# Patient Record
Sex: Male | Born: 1980
Health system: Southern US, Community
[De-identification: ages and names within clinical notes are randomized; demographics above are authoritative.]

## PROBLEM LIST (undated history)

## (undated) DIAGNOSIS — F419 Anxiety disorder, unspecified: Secondary | ICD-10-CM

## (undated) DIAGNOSIS — G8929 Other chronic pain: Secondary | ICD-10-CM

## (undated) DIAGNOSIS — I10 Essential (primary) hypertension: Secondary | ICD-10-CM

## (undated) DIAGNOSIS — F988 Other specified behavioral and emotional disorders with onset usually occurring in childhood and adolescence: Secondary | ICD-10-CM

## (undated) DIAGNOSIS — M545 Low back pain, unspecified: Secondary | ICD-10-CM

## (undated) DIAGNOSIS — T24331A Burn of third degree of right lower leg, initial encounter: Secondary | ICD-10-CM

## (undated) DIAGNOSIS — I7300 Raynaud's syndrome without gangrene: Secondary | ICD-10-CM

## (undated) HISTORY — DX: Other specified behavioral and emotional disorders with onset usually occurring in childhood and adolescence: F98.8

## (undated) HISTORY — PX: SKIN GRAFT: SHX250

## (undated) HISTORY — DX: Raynaud's syndrome without gangrene: I73.00

## (undated) HISTORY — DX: Burn of third degree of right lower leg, initial encounter: T24.331A

## (undated) HISTORY — DX: Anxiety disorder, unspecified: F41.9

---

## 2004-05-14 HISTORY — PX: ORIF ACROMIOCLAVICULAR JOINT: SUR916

## 2004-06-06 ENCOUNTER — Ambulatory Visit (HOSPITAL_BASED_OUTPATIENT_CLINIC_OR_DEPARTMENT_OTHER): Admission: RE | Admit: 2004-06-06 | Discharge: 2004-06-06 | Payer: Self-pay | Admitting: Orthopedic Surgery

## 2008-09-13 DIAGNOSIS — T24331A Burn of third degree of right lower leg, initial encounter: Secondary | ICD-10-CM

## 2008-09-13 HISTORY — DX: Burn of third degree of right lower leg, initial encounter: T24.331A

## 2008-09-27 ENCOUNTER — Emergency Department (HOSPITAL_COMMUNITY): Admission: EM | Admit: 2008-09-27 | Discharge: 2008-09-27 | Payer: Self-pay | Admitting: Emergency Medicine

## 2008-10-19 ENCOUNTER — Encounter: Admission: RE | Admit: 2008-10-19 | Discharge: 2008-10-29 | Payer: Self-pay | Admitting: Surgery
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
## 2011-09-15 LAB — BASIC METABOLIC PANEL
Calcium: 8.8
Chloride: 105
Creatinine, Ser: 0.81
GFR calc Af Amer: >60
GFR calc non Af Amer: >60

## 2011-09-15 LAB — DIFFERENTIAL
Lymphs Abs: 2.7
Monocytes Relative: 7
Neutro Abs: 3.9
Neutrophils Relative %: 52

## 2011-09-15 LAB — CBC
RBC: 5.43
WBC: 7.5

## 2011-12-01 ENCOUNTER — Ambulatory Visit (INDEPENDENT_AMBULATORY_CARE_PROVIDER_SITE_OTHER): Payer: BC Managed Care – PPO | Admitting: Physician Assistant

## 2011-12-01 DIAGNOSIS — E782 Mixed hyperlipidemia: Secondary | ICD-10-CM

## 2011-12-01 DIAGNOSIS — J029 Acute pharyngitis, unspecified: Secondary | ICD-10-CM

## 2011-12-01 DIAGNOSIS — J069 Acute upper respiratory infection, unspecified: Secondary | ICD-10-CM

## 2012-01-15 ENCOUNTER — Telehealth: Payer: Self-pay

## 2012-01-15 NOTE — Telephone Encounter (Signed)
.  [facility] PT WOULD LIKE TO SPEAK WITH CHELLE REGARDING HIS MEDS PLEASE CALL [phone]

## 2012-01-16 NOTE — Telephone Encounter (Signed)
LMOM for patient to call back with questions.

## 2012-01-16 NOTE — Telephone Encounter (Signed)
Patient called back.  States his meds for cholesterol are over $200 apiece!  Pt would like more affordable meds called in.  He was previously rx'd Deplin and Niaspan.  Pt uses [facility].  Please advise next med.Marland KitchenMarland Kitchen

## 2012-01-17 ENCOUNTER — Encounter: Payer: Self-pay | Admitting: Physician Assistant

## 2012-01-17 DIAGNOSIS — I7300 Raynaud's syndrome without gangrene: Secondary | ICD-10-CM | POA: Insufficient documentation

## 2012-01-17 DIAGNOSIS — F419 Anxiety disorder, unspecified: Secondary | ICD-10-CM | POA: Insufficient documentation

## 2012-01-17 DIAGNOSIS — F988 Other specified behavioral and emotional disorders with onset usually occurring in childhood and adolescence: Secondary | ICD-10-CM | POA: Insufficient documentation

## 2012-01-17 NOTE — Telephone Encounter (Signed)
I would like him to go ahead and fill the Niaspan.  When we re-check his lipids in 12 weeks, we can see if we need to add the Deplin. csj

## 2012-01-17 NOTE — Telephone Encounter (Signed)
Per patient, Niaspan still too expensive.    Chelle advises we contact pharmacy to find out why rx is that cost.  [facility] closed for Sunday... Please contact them tomorrow.

## 2012-03-15 ENCOUNTER — Telehealth: Payer: Self-pay

## 2012-03-15 MED ORDER — AMPHETAMINE-DEXTROAMPHETAMINE 30 MG PO TABS: 15.0000 mg | ORAL_TABLET | Freq: Every day | ORAL | Status: DC

## 2012-03-15 NOTE — Telephone Encounter (Signed)
Signed at TL desk.  

## 2012-03-15 NOTE — Telephone Encounter (Signed)
Advised pt that rx is ready for pick up.

## 2012-03-15 NOTE — Telephone Encounter (Signed)
PT REQUESTING ADDERALL REFILL    BEST PHONE [phone]

## 2012-03-16 ENCOUNTER — Telehealth: Payer: Self-pay

## 2012-03-16 MED ORDER — AMPHETAMINE-DEXTROAMPHETAMINE 30 MG PO TABS: 15.0000 mg | ORAL_TABLET | Freq: Three times a day (TID) | ORAL | Status: DC

## 2012-03-16 NOTE — Telephone Encounter (Signed)
I accidentally wrote the adderall for qd not tid.  The Rx has been fixed and is ready for pickup.

## 2012-03-16 NOTE — Telephone Encounter (Signed)
Addended by: Morrell Riddle on: 03/16/2012 01:07 PM   Modules accepted: Orders

## 2012-03-16 NOTE — Telephone Encounter (Signed)
Andrew Chen with pharmacy earlier and clarified rx.

## 2012-03-16 NOTE — Telephone Encounter (Signed)
Pharmacist has questions about adderall 30mg    The old rx was for 1/2 3 times a day and now the new one is 1/2 daily   They would like clarification  Best number [phone]

## 2012-03-17 NOTE — Telephone Encounter (Signed)
Notified pt that Rx has been corrected and is ready for p/up

## 2012-04-18 ENCOUNTER — Telehealth: Payer: Self-pay

## 2012-04-18 MED ORDER — DILTIAZEM HCL ER COATED BEADS 240 MG PO CP24: 240.0000 mg | ORAL_CAPSULE | Freq: Every day | ORAL | Status: DC

## 2012-04-18 MED ORDER — AMPHETAMINE-DEXTROAMPHETAMINE 30 MG PO TABS: 15.0000 mg | ORAL_TABLET | Freq: Three times a day (TID) | ORAL | Status: DC

## 2012-04-18 NOTE — Telephone Encounter (Signed)
Due for re-evaluation next month. Cardizem and Adderall printed.

## 2012-04-18 NOTE — Telephone Encounter (Signed)
Looks like we need to recheck patient for both his ADD and HTN-- can we do 30 day rx and have him follow up for more?

## 2012-04-18 NOTE — Telephone Encounter (Signed)
.  [facility] The patient called for refill of his Cardizem Rx and his Adderall Rx.  The patient states he gets 90 day supply of Cardizem and usually picks it up with Adderall Rx.  Please call patient at [phone] when Rx is ready for pick up.

## 2012-04-18 NOTE — Telephone Encounter (Signed)
Spoke with patient and advised medications were ready for pickup, but that he was due for office visit next month.  Patient was frustrated with this and stated he should have been told this when he left the message earlier today.  I let him know that this was not urgent that he come in right away, just follow up in the next 30 days.  Patient didn't like this, and refused to take refills we have already written.  He will CB and schedule appointment and have all of his medications refilled at one time.  Rx's placed in patients chart to hold until patient comes in (per Chelles request).

## 2012-04-21 ENCOUNTER — Ambulatory Visit (INDEPENDENT_AMBULATORY_CARE_PROVIDER_SITE_OTHER): Payer: BC Managed Care – PPO | Admitting: Physician Assistant

## 2012-04-21 ENCOUNTER — Encounter: Payer: Self-pay | Admitting: Physician Assistant

## 2012-04-21 VITALS — BP 132/106 | HR 95 | Temp 98.6°F | Resp 18 | Ht 73.0 in | Wt 187.0 lb

## 2012-04-21 DIAGNOSIS — I10 Essential (primary) hypertension: Secondary | ICD-10-CM

## 2012-04-21 DIAGNOSIS — E785 Hyperlipidemia, unspecified: Secondary | ICD-10-CM

## 2012-04-21 DIAGNOSIS — I7300 Raynaud's syndrome without gangrene: Secondary | ICD-10-CM

## 2012-04-21 DIAGNOSIS — B360 Pityriasis versicolor: Secondary | ICD-10-CM

## 2012-04-21 DIAGNOSIS — F909 Attention-deficit hyperactivity disorder, unspecified type: Secondary | ICD-10-CM

## 2012-04-21 DIAGNOSIS — M25476 Effusion, unspecified foot: Secondary | ICD-10-CM

## 2012-04-21 MED ORDER — AMPHETAMINE-DEXTROAMPHETAMINE 30 MG PO TABS: ORAL_TABLET | ORAL | Status: DC

## 2012-04-21 MED ORDER — KETOCONAZOLE 200 MG PO TABS: 400.0000 mg | ORAL_TABLET | Freq: Once | ORAL | Status: AC

## 2012-04-21 MED ORDER — AMPHETAMINE-DEXTROAMPHET ER 30 MG PO CP24: 30.0000 mg | ORAL_CAPSULE | Freq: Three times a day (TID) | ORAL | Status: DC

## 2012-04-21 MED ORDER — DILTIAZEM HCL ER COATED BEADS 240 MG PO CP24: 240.0000 mg | ORAL_CAPSULE | Freq: Every day | ORAL | Status: DC

## 2012-04-21 NOTE — Progress Notes (Signed)
  Subjective:    Patient ID: Andrew Chen, male    DOB: 1981/10/18, 31 y.o.   MRN: [id number]  HPI  Presents for refill of Cardizem, which he uses to treat Raynaud's phenomenon.  Additionally, needs new prescriptions of Adderall for ADD and requests a new Rx for ketoconazole for treatment of recurrent tinea versicolor.  Allergies have been worse this season.  Using OTC Allegra D daily, and occasionally an additional dose of Benadryl, and on one occasion, used nasal decongestant spray.  Notes that the morning dose of Adderall (half of a 30 mg tab) is no longer effective.  The afternoon and evening doses work well.  He never filled the prescriptions for niaspan and deplin I advised after his lipscience results in 12/2011 revealed his increased lipids.  His father, and multiple other paternal relatives, have had early cardiovascular disease.  Instead, he has eliminated fast food and reports making much healthier eating choices overall.  He's hopeful that his levels will have improved.  Review of Systems No chest pain, SOB, HA, dizziness, vision change, N/V, diarrhea, dysuria, myalgias, arthralgias or rash.     Objective:   Physical Exam Vital signs noted. Well-developed, well nourished WM who is awake, alert and oriented, in NAD. HEENT: [place]/AT, sclera and conjunctiva are clear.   Nasal mucosa is pink and moist. OP is clear. Neck: supple, non-tender, no lymphadenopathy, thyromegaly. Heart: RRR, no murmur Lungs: CTA Extremities: no cyanosis, clubbing or edema. Skin: warm and dry without rash.  LipoScience profile updated today to compare with previous measures.    Assessment & Plan:   1. Hyperlipidemia  Await LipoScience results.  2. Raynaud's phenomenon  diltiazem (CARDIZEM CD) 240 MG 24 hr capsule  3. Tinea versicolor  ketoconazole (NIZORAL) 200 MG tablet  4. ADD (attention deficit disorder with hyperactivity)  amphetamine-dextroamphetamine (ADDERALL, 30MG ,) 30 MG tablet (rx's for  May, June and July)   Re-evaluate in 6 months.

## 2012-05-02 ENCOUNTER — Other Ambulatory Visit: Payer: Self-pay | Admitting: Physician Assistant

## 2012-05-02 DIAGNOSIS — E785 Hyperlipidemia, unspecified: Secondary | ICD-10-CM

## 2012-05-02 MED ORDER — NIACIN ER (ANTIHYPERLIPIDEMIC) 500 MG PO TBCR: 500.0000 mg | EXTENDED_RELEASE_TABLET | Freq: Every day | ORAL | Status: DC

## 2012-05-02 NOTE — Progress Notes (Signed)
Advised patient of labs and Rx was sent to [facility].  I have mailed patient the original Labs per Chelle.  Patient states understanding and will return to clinic in 3-6 months.

## 2012-05-23 ENCOUNTER — Telehealth: Payer: Self-pay

## 2012-05-23 NOTE — Telephone Encounter (Signed)
PATIENT WANTS CHELLE TO CALL HIM BACK ABOUT HIS BLOODWORK AND MEDICATION

## 2012-05-25 NOTE — Telephone Encounter (Signed)
Please call patient for details

## 2012-05-25 NOTE — Telephone Encounter (Signed)
Yes, non-fasting can elevate the levels, especially the triglycerides.  However, given his family history, I still encourage him to take the Niaspan. Per the info I have, the approximate retail price of 500mg  is $89.99 for #30.  If his pharmacy is charging more than that, he should shop around for a better price.  I suggest [email or web address] out of [place], [place].  They do mail order and have a store, but advertise some of the best prices around.

## 2012-05-25 NOTE — Telephone Encounter (Signed)
Pt indicates the Niacin Rx was $312, states he was not fasting for blood work, and wants to know if this affects the lab results, for Cholesterol level. States he has changed his diet and watching what he eats, and has eliminated fast foods. Wants to have rechecked fasting. Please advise.

## 2012-05-26 NOTE — Telephone Encounter (Signed)
Gave pt message. Pt agreed to check pricing at different pharmacies and will let us know what he decides.

## 2012-09-01 ENCOUNTER — Telehealth: Payer: Self-pay

## 2012-09-01 MED ORDER — AMPHETAMINE-DEXTROAMPHETAMINE 30 MG PO TABS: ORAL_TABLET | ORAL | Status: DC

## 2012-09-01 NOTE — Telephone Encounter (Signed)
Pt is requesting rx for adderrall and his other rx he couldn't remember the name of please call when ready for pick-up. [phone]

## 2012-09-01 NOTE — Telephone Encounter (Signed)
Adderall refill is printed and up front. Patient is on several different medications. I need to know which medication he specifically needs a refill of before I can review it for possible refill.

## 2012-09-01 NOTE — Telephone Encounter (Signed)
Pt doesn't remember med, possibly Xanax?

## 2012-09-01 NOTE — Telephone Encounter (Signed)
Please call his pharmacy he was given #90 pills on 04/21/12 with 3 refills. He takes one tab per day. Should have some on file.

## 2012-09-01 NOTE — Telephone Encounter (Signed)
Rx up font. Called pt he needs diltizeim.

## 2012-09-02 NOTE — Telephone Encounter (Signed)
Patient advised.

## 2012-09-02 NOTE — Telephone Encounter (Signed)
Called pharmacy who reported that pt does have 3 more RFs of his diltiazem on file and they will get one ready for him.  LMOM for pt to CB.

## 2012-11-05 ENCOUNTER — Telehealth: Payer: Self-pay

## 2012-11-05 NOTE — Telephone Encounter (Signed)
The patient called to request refill of his Adderall rx.  Please call the patient at [phone] when ready for pick up.

## 2012-11-06 MED ORDER — AMPHETAMINE-DEXTROAMPHETAMINE 30 MG PO TABS: ORAL_TABLET | ORAL | Status: DC

## 2012-11-06 NOTE — Telephone Encounter (Signed)
Patient notified and voiced understanding.

## 2012-11-06 NOTE — Telephone Encounter (Signed)
Rx printed.  Needs OV for additional fills.

## 2012-12-17 ENCOUNTER — Telehealth: Payer: Self-pay

## 2012-12-17 NOTE — Telephone Encounter (Signed)
Patient would like refill of adderall. °

## 2012-12-18 NOTE — Telephone Encounter (Signed)
Needs office visit per last prescription.  He was last seen in May.

## 2012-12-19 ENCOUNTER — Ambulatory Visit (INDEPENDENT_AMBULATORY_CARE_PROVIDER_SITE_OTHER): Payer: BC Managed Care – PPO | Admitting: Emergency Medicine

## 2012-12-19 VITALS — BP 134/90 | HR 102 | Temp 98.3°F | Resp 16 | Ht 73.5 in | Wt 182.6 lb

## 2012-12-19 DIAGNOSIS — I7300 Raynaud's syndrome without gangrene: Secondary | ICD-10-CM

## 2012-12-19 DIAGNOSIS — F988 Other specified behavioral and emotional disorders with onset usually occurring in childhood and adolescence: Secondary | ICD-10-CM

## 2012-12-19 MED ORDER — AMPHETAMINE-DEXTROAMPHETAMINE 30 MG PO TABS: ORAL_TABLET | ORAL | Status: DC

## 2012-12-19 NOTE — Progress Notes (Signed)
  Subjective:    Patient ID: Andrew Chen, male    DOB: [birth year], 32 y.o.   MRN: [id number]  HPI Patient in for followup of his Raynaud's phenomenon as well as his ADD. His ADD medicines he takes one in the morning half around noon and a half around 4. His Cardizem he takes every day and if he skips a dose he does have significant problems with his hands to   Review of Systems     Objective:   Physical Exam patient is alert and cooperative in no distress. His neck is supple. His chest is clear to auscultation and percussion. Skin is warm and to        Assessment & Plan:  Problem Is ray not phenomenon stable on diltiazem sustained release. Problem #2 ADD stable on current medications. Problem #3 anxiety disorder which is currently stable. He requires approximately one Xanax per week. He tends to have more panic attacks when there is a loss in the family. It is okay for him to call in 3 months to get a 3 month refill on his ADD medicines.

## 2012-12-19 NOTE — Telephone Encounter (Signed)
Spoke with pt, advised to RTC. Pt understood. 

## 2013-03-16 ENCOUNTER — Encounter: Payer: Self-pay | Admitting: Internal Medicine

## 2013-06-29 ENCOUNTER — Telehealth: Payer: Self-pay

## 2013-06-29 ENCOUNTER — Ambulatory Visit: Payer: BC Managed Care – PPO

## 2013-06-29 ENCOUNTER — Ambulatory Visit: Payer: BC Managed Care – PPO | Admitting: Family Medicine

## 2013-06-29 VITALS — BP 130/76 | HR 74 | Temp 98.4°F | Resp 18 | Ht 74.0 in | Wt 196.0 lb

## 2013-06-29 DIAGNOSIS — F988 Other specified behavioral and emotional disorders with onset usually occurring in childhood and adolescence: Secondary | ICD-10-CM

## 2013-06-29 MED ORDER — AMPHETAMINE-DEXTROAMPHETAMINE 30 MG PO TABS: ORAL_TABLET | ORAL | Status: DC

## 2013-06-29 NOTE — Telephone Encounter (Signed)
Pt requesting adderall refill  Best phone [phone]
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
## 2013-06-29 NOTE — Telephone Encounter (Signed)
Called patient he is due for follow up for Adderall refill, he wants appt, he is transferred.

## 2014-02-02 ENCOUNTER — Other Ambulatory Visit: Payer: Self-pay | Admitting: Physician Assistant

## 2014-02-02 NOTE — Telephone Encounter (Signed)
Pt needs OV 

## 2014-02-16 ENCOUNTER — Ambulatory Visit (INDEPENDENT_AMBULATORY_CARE_PROVIDER_SITE_OTHER): Payer: No Typology Code available for payment source | Admitting: Internal Medicine

## 2014-02-16 ENCOUNTER — Ambulatory Visit: Payer: No Typology Code available for payment source

## 2014-02-16 VITALS — BP 132/100 | HR 104 | Temp 98.0°F | Resp 16 | Ht 73.5 in | Wt 198.0 lb

## 2014-02-16 DIAGNOSIS — E785 Hyperlipidemia, unspecified: Secondary | ICD-10-CM

## 2014-02-16 DIAGNOSIS — M79644 Pain in right finger(s): Secondary | ICD-10-CM

## 2014-02-16 DIAGNOSIS — I7300 Raynaud's syndrome without gangrene: Secondary | ICD-10-CM

## 2014-02-16 DIAGNOSIS — F411 Generalized anxiety disorder: Secondary | ICD-10-CM

## 2014-02-16 DIAGNOSIS — M79609 Pain in unspecified limb: Secondary | ICD-10-CM

## 2014-02-16 DIAGNOSIS — F988 Other specified behavioral and emotional disorders with onset usually occurring in childhood and adolescence: Secondary | ICD-10-CM

## 2014-02-16 DIAGNOSIS — F419 Anxiety disorder, unspecified: Secondary | ICD-10-CM

## 2014-02-16 MED ORDER — ALPRAZOLAM 0.5 MG PO TABS: 0.5000 mg | ORAL_TABLET | Freq: Every day | ORAL | Status: DC | PRN

## 2014-02-16 MED ORDER — AMPHETAMINE-DEXTROAMPHETAMINE 30 MG PO TABS: ORAL_TABLET | ORAL | Status: DC

## 2014-02-16 MED ORDER — ALPRAZOLAM 0.5 MG PO TABS
0.5000 mg | ORAL_TABLET | Freq: Every day | ORAL | Status: DC | PRN
Start: 2014-02-16 — End: 2015-01-09

## 2014-02-16 MED ORDER — DILTIAZEM HCL ER COATED BEADS 240 MG PO CP24: ORAL_CAPSULE | ORAL | Status: DC

## 2014-02-16 NOTE — Patient Instructions (Signed)
If you have not heard anything regarding the referral in 1 week, please contact our office.  

## 2014-02-16 NOTE — Progress Notes (Signed)
Subjective:    Patient ID: Andrew Chen., male    DOB: 04/14/1981, 33 y.o.   MRN: [id number]   PCP: Muntaha Vermette, PA-C  Chief Complaint  Patient presents with  . Hand Injury    right hand pain  . Medication Refill    cardizem    Medications, allergies, past medical history, surgical history, family history, social history and problem list reviewed and updated.  HPI  Skiing almost 2 weeks ago.  [place] and injured the RIGHT hand.  RIGHT hand dominant. Taped the thumb and continued skiing x 2 days.  Has not had much work since then, and has wrapped it with an ACE wrap. Swelling that obscurred the 2nd and 3rd MCPs, is now nearly resolved.  Ecchymosis involving the THUMB and wrist is resolved. Pain with grasping. Pain with ROM.  Locates the worst pain at the MCP, dorsal aspect.   2 days ago he was putting a leaf blower pack on his back with the affected hand and felt/heard a loud crack, followed by immediate improvement in his pain.  There is still considerable pain, but much better. He's had increased, but still limited, ROM of the thumb.  Additionally, he needs refills of cardizem, Adderall and alprazolam.  Those are working well for treatment of Raynaud's phenomenon, anxiety and ADD.  Review of Systems As above.    Objective:   Physical Exam  Vitals reviewed. Constitutional: He is oriented to person, place, and time. He appears well-developed and well-nourished. He is active and cooperative. No distress.  BP 132/100  Pulse 104  Temp(Src) 98 F (36.7 C) (Oral)  Resp 16  Ht 6' 1.5" (1.867 m)  Wt 198 lb (89.812 kg)  BMI 25.77 kg/m2  SpO2 98%   HENT:  Head: Normocephalic and atraumatic.  Eyes: Conjunctivae are normal. No scleral icterus.  Neck: Normal range of motion. Neck supple. No thyromegaly present.  Cardiovascular: Regular rhythm, normal heart sounds and intact distal pulses.   Pulmonary/Chest: Effort normal and breath sounds normal.  Musculoskeletal:   Right wrist: He exhibits decreased range of motion, tenderness and swelling.       Right hand: He exhibits normal capillary refill (mild pallor consistent with Raynaud's). Normal sensation noted. Decreased strength (due to pain) noted.       Hands: Lymphadenopathy:    He has no cervical adenopathy.  Neurological: He is alert and oriented to person, place, and time.  Skin: Skin is warm and dry. No erythema.  Psychiatric: He has a normal mood and affect. His behavior is normal.   RIGHT Thumb: [facility] reading (PRIMARY) by  Dr. Perrin Maltese.  No bony deformity.         Assessment & Plan:  1. Pain of right thumb I suspect he had a dislocation, followed by reduction a couple of days ago.  Given the severity of his continued pain, despite a stable joint, he's splinted and referred to Hand. - DG Finger Thumb Right; Future - Ambulatory referral to Hand Surgery  2. Raynaud's phenomenon Stable.  Continue current treatment. - diltiazem (CARDIZEM CD) 240 MG 24 hr capsule; TAKE 1 CAPSULE BY MOUTH ONCE A DAY  Dispense: 90 capsule; Refill: 3  3. Hyperlipidemia Not fasting this morning.  Several years since last lipid profile.  His father had early onset CAD, and TD is at risk.  Plan fasting labs this spring/summer, at his convenience.  4. ADD (attention deficit disorder) Stable.  Continue current regimen. - amphetamine-dextroamphetamine (ADDERALL) 30 MG tablet; Take 1  PO QAM, 1/2 PO Qmidday, 1/2 PO QPM.  Dispense: 60 tablet; Refill: 0 - amphetamine-dextroamphetamine (ADDERALL) 30 MG tablet; Take 1 PO QAM, 1/2 PO Qmidday, 1/2 PO QPM. May fill 30 days after date on prescription.  Dispense: 60 tablet; Refill: 0 - amphetamine-dextroamphetamine (ADDERALL) 30 MG tablet; Take 1 PO QAM, 1/2 PO Qmidday, 1/2 PO QPM. May fill 60 days after date on prescription.  Dispense: 60 tablet; Refill: 0  5. Anxiety Stable.  Continue current regimen. - ALPRAZolam (XANAX) 0.5 MG tablet; Take 1 tablet (0.5 mg total) by mouth  daily as needed for anxiety.  Dispense: 30 tablet; Refill: 0 - ALPRAZolam (XANAX) 0.5 MG tablet; Take 1 tablet (0.5 mg total) by mouth daily as needed for anxiety. May fill 30 days after date on prescription  Dispense: 30 tablet; Refill: 0 - ALPRAZolam (XANAX) 0.5 MG tablet; Take 1 tablet (0.5 mg total) by mouth daily as needed for anxiety. May fill 60 days after date on prescription  Dispense: 30 tablet; Refill: 0   Fernande Brashelle S. Nubia Ziesmer, PA-C Physician Assistant-Certified [facility]

## 2014-02-27 ENCOUNTER — Encounter: Payer: Self-pay | Admitting: Physician Assistant

## 2014-02-28 NOTE — Telephone Encounter (Signed)
In referral notes see Referral was sent to [facility] 3/11- called to check status for pt. Alex states that they do not have information on this referral. Contacted our referral dept and they are resending to [facility].

## 2014-02-28 NOTE — Telephone Encounter (Signed)
Andrew Chen is working on this. She said she was trying to find an office that accepts pt's insurance.

## 2014-03-01 ENCOUNTER — Encounter: Payer: Self-pay | Admitting: Physician Assistant

## 2014-03-01 NOTE — Telephone Encounter (Signed)
Chelle, FYI for appt date and time. Made a copy of his disc and let him know it was up front for p/u

## 2014-03-07 ENCOUNTER — Ambulatory Visit (HOSPITAL_COMMUNITY)
Admission: RE | Admit: 2014-03-07 | Discharge: 2014-03-07 | Disposition: A | Discharge status: Home / Self Care | Payer: No Typology Code available for payment source | Source: Ambulatory Visit | Attending: Sports Medicine | Admitting: Sports Medicine

## 2014-03-07 ENCOUNTER — Other Ambulatory Visit (HOSPITAL_COMMUNITY): Payer: Self-pay | Admitting: Sports Medicine

## 2014-03-07 DIAGNOSIS — M79641 Pain in right hand: Secondary | ICD-10-CM

## 2014-03-07 DIAGNOSIS — Z135 Encounter for screening for eye and ear disorders: Secondary | ICD-10-CM | POA: Insufficient documentation

## 2014-03-16 ENCOUNTER — Other Ambulatory Visit: Payer: Self-pay | Admitting: Sports Medicine

## 2014-03-16 DIAGNOSIS — S6390XA Sprain of unspecified part of unspecified wrist and hand, initial encounter: Secondary | ICD-10-CM

## 2014-03-21 ENCOUNTER — Ambulatory Visit
Admission: RE | Admit: 2014-03-21 | Discharge: 2014-03-21 | Disposition: A | Discharge status: Home / Self Care | Payer: No Typology Code available for payment source | Source: Ambulatory Visit | Attending: Sports Medicine | Admitting: Sports Medicine

## 2014-03-21 DIAGNOSIS — S6390XA Sprain of unspecified part of unspecified wrist and hand, initial encounter: Secondary | ICD-10-CM

## 2014-07-02 ENCOUNTER — Telehealth: Payer: Self-pay | Admitting: Physician Assistant

## 2014-07-02 DIAGNOSIS — F988 Other specified behavioral and emotional disorders with onset usually occurring in childhood and adolescence: Secondary | ICD-10-CM

## 2014-07-03 MED ORDER — AMPHETAMINE-DEXTROAMPHETAMINE 30 MG PO TABS: ORAL_TABLET | ORAL | Status: DC

## 2014-07-03 MED ORDER — AMPHETAMINE-DEXTROAMPHETAMINE 30 MG PO TABS
ORAL_TABLET | ORAL | Status: DC
Start: 2014-07-03 — End: 2015-01-09

## 2014-07-03 NOTE — Telephone Encounter (Signed)
Patient notified via My Chart.  Meds ordered this encounter  Medications  . amphetamine-dextroamphetamine (ADDERALL) 30 MG tablet    Sig: Take 1 PO QAM, 1/2 PO Qmidday, 1/2 PO QPM.    Dispense:  60 tablet    Refill:  0    Order Specific Question:  Supervising Provider    Answer:  DOOLITTLE, ROBERT P [[id number]]  . amphetamine-dextroamphetamine (ADDERALL) 30 MG tablet    Sig: Take 1 PO QAM, 1/2 PO Qmidday, 1/2 PO QPM. May fill 30 days after date on prescription.    Dispense:  60 tablet    Refill:  0    Order Specific Question:  Supervising Provider    Answer:  DOOLITTLE, ROBERT P [[id number]]  . amphetamine-dextroamphetamine (ADDERALL) 30 MG tablet    Sig: Take 1 PO QAM, 1/2 PO Qmidday, 1/2 PO QPM. May fill 60 days after date on prescription.    Dispense:  60 tablet    Refill:  0    Order Specific Question:  Supervising Provider    Answer:  DOOLITTLE, ROBERT P [[id number]]

## 2014-07-03 NOTE — Telephone Encounter (Signed)
Prescriptions in pick up drawer.

## 2014-09-28 ENCOUNTER — Other Ambulatory Visit: Payer: Self-pay

## 2015-01-09 ENCOUNTER — Telehealth: Payer: Self-pay | Admitting: Physician Assistant

## 2015-01-09 DIAGNOSIS — I7300 Raynaud's syndrome without gangrene: Secondary | ICD-10-CM

## 2015-01-09 DIAGNOSIS — F988 Other specified behavioral and emotional disorders with onset usually occurring in childhood and adolescence: Secondary | ICD-10-CM

## 2015-01-09 DIAGNOSIS — F419 Anxiety disorder, unspecified: Secondary | ICD-10-CM

## 2015-01-09 MED ORDER — AMPHETAMINE-DEXTROAMPHETAMINE 30 MG PO TABS: ORAL_TABLET | ORAL | Status: DC

## 2015-01-09 MED ORDER — ALPRAZOLAM 0.5 MG PO TABS
0.5000 mg | ORAL_TABLET | Freq: Every day | ORAL | Status: DC | PRN
Start: 2015-01-09 — End: 2016-01-24

## 2015-01-09 MED ORDER — ALPRAZOLAM 0.5 MG PO TABS: 0.5000 mg | ORAL_TABLET | Freq: Every day | ORAL | Status: DC | PRN

## 2015-01-09 MED ORDER — DILTIAZEM HCL ER COATED BEADS 240 MG PO CP24: ORAL_CAPSULE | ORAL | Status: DC

## 2015-01-09 NOTE — Telephone Encounter (Signed)
Needs refills of his medications. Over-eating, especially high sugar foods. Will get new health insurance and schedule follow-up.

## 2016-01-24 ENCOUNTER — Ambulatory Visit (INDEPENDENT_AMBULATORY_CARE_PROVIDER_SITE_OTHER): Payer: Self-pay | Admitting: Physician Assistant

## 2016-01-24 VITALS — BP 114/76 | HR 85 | Temp 98.6°F | Resp 16 | Ht 73.5 in | Wt 216.0 lb

## 2016-01-24 DIAGNOSIS — R3589 Other polyuria: Secondary | ICD-10-CM

## 2016-01-24 DIAGNOSIS — R358 Other polyuria: Secondary | ICD-10-CM

## 2016-01-24 DIAGNOSIS — F988 Other specified behavioral and emotional disorders with onset usually occurring in childhood and adolescence: Secondary | ICD-10-CM

## 2016-01-24 DIAGNOSIS — F419 Anxiety disorder, unspecified: Secondary | ICD-10-CM

## 2016-01-24 DIAGNOSIS — I7300 Raynaud's syndrome without gangrene: Secondary | ICD-10-CM

## 2016-01-24 DIAGNOSIS — F909 Attention-deficit hyperactivity disorder, unspecified type: Secondary | ICD-10-CM

## 2016-01-24 LAB — POCT GLYCOSYLATED HEMOGLOBIN (HGB A1C): Hemoglobin A1C: 5.7

## 2016-01-24 LAB — GLUCOSE, POCT (MANUAL RESULT ENTRY): POC GLUCOSE: 81 mg/dL (ref 70–99)

## 2016-01-24 MED ORDER — AMPHETAMINE-DEXTROAMPHETAMINE 30 MG PO TABS: ORAL_TABLET | ORAL | Status: DC

## 2016-01-24 MED ORDER — ALPRAZOLAM 0.5 MG PO TABS: 0.5000 mg | ORAL_TABLET | Freq: Every day | ORAL | Status: DC | PRN

## 2016-01-24 MED ORDER — DILTIAZEM HCL ER COATED BEADS 240 MG PO CP24: ORAL_CAPSULE | ORAL | Status: DC

## 2016-01-24 NOTE — Patient Instructions (Signed)
We'll do the rest of your labs, fasting, when you have insurance.

## 2016-01-24 NOTE — Progress Notes (Signed)
Patient ID: Andrew Chen., male    DOB: 02/11/1981, 35 y.o.   MRN: [id number]  PCP: Olene Floss  Subjective:   Chief Complaint  Patient presents with  . Medication Refill    Xanax, cardizem, adderal    HPI Presents for evaluation of ADD, fatigue and weight gain since stopping Adderall.  He decided that as a 35 year old person, he "shouldn't need all this medicine." Stopped Adderall in April 2016. Fatigued. No energy. Eating more, especially sugary things, in order to stay awake. Gained 25 lbs. Increased stress, worrying about just getting life-maintenance done. "I am exhausted. I struggle to get dressed, get my shoes on and get out of the door in the morning. I'm in a daze."  Gets distracted. Inattentive. No falling asleep during activities. Feels like he needs multiple naps each day, but can only sleep for 30 minutes, usually in the afternoon, and then gets mad at himself for "wasting time sleeping" when he should be doing work. Self employed Surveyor, minerals.  Sleeping is "hit or miss. A lot of nights I sleep like crap." When initially was stopping Adderall, he had sweats at night, lots of anxiety. "My brain is all over the place. I'm scatter-brained all the time."  Mother has ADD. Father with DM and heart disease.  Engaged to be married 05/16/2016. Will be on her insurance after their marriage. She is a Engineer, civil (consulting), recently moved from [facility] to [facility].  Review of Systems  Constitutional: Positive for fatigue and unexpected weight change. Negative for fever, chills, diaphoresis, activity change and appetite change.  Eyes: Negative for photophobia and visual disturbance.  Respiratory: Negative for cough, shortness of breath and wheezing.   Cardiovascular: Negative for chest pain, palpitations and leg swelling.  Gastrointestinal: Negative for nausea, vomiting, abdominal pain, diarrhea and constipation.  Endocrine: Positive for polyuria. Negative for cold  intolerance, heat intolerance, polydipsia and polyphagia.  Genitourinary: Negative for dysuria, urgency, frequency and difficulty urinating.  Musculoskeletal: Negative for myalgias and arthralgias.  Skin: Negative for rash.  Neurological: Negative for dizziness, weakness, light-headedness and headaches.  Hematological: Negative for adenopathy. Does not bruise/bleed easily.  Psychiatric/Behavioral: Positive for sleep disturbance and decreased concentration. Negative for suicidal ideas, behavioral problems, self-injury and dysphoric mood. The patient is nervous/anxious.        Patient Active Problem List   Diagnosis Date Noted  . Hyperlipidemia 02/16/2014  . Attention deficit disorder (ADD)   . Anxiety   . Raynaud's phenomenon      Prior to Admission medications   Medication Sig Start Date End Date Taking? Authorizing Provider  ALPRAZolam Prudy Feeler) 0.5 MG tablet Take 1 tablet (0.5 mg total) by mouth daily as needed for anxiety. 01/09/15  Yes Stori Royse, PA-C  amphetamine-dextroamphetamine (ADDERALL) 30 MG tablet Take 1 PO QAM, 1/2 PO Qmidday, 1/2 PO QPM. 01/09/15  Yes Keny Donald, PA-C  amphetamine-dextroamphetamine (ADDERALL) 30 MG tablet Take 1 PO QAM, 1/2 PO Qmidday, 1/2 PO QPM. May fill 60 days after date on prescription. 01/09/15  Yes Anwar Crill, PA-C  diltiazem (CARDIZEM CD) 240 MG 24 hr capsule TAKE 1 CAPSULE BY MOUTH ONCE A DAY 01/09/15  Yes Navy Rothschild, PA-C  Multiple Vitamins-Minerals (MULTIVITAMIN PO) Take 1 tablet by mouth daily.   Yes Historical Provider, MD  niacin (NIASPAN) 500 MG CR tablet Take 1 tablet (500 mg total) by mouth at bedtime. Take with applesauce 30 minutes after aspirin 81 mg to reduce flushing. 05/02/12 05/02/13  Porfirio Oar, PA-C  No Known Allergies     Objective:  Physical Exam  Constitutional: He is oriented to person, place, and time. He appears well-developed and well-nourished. He is active and cooperative. No distress.  BP 114/76  mmHg  Pulse 85  Temp(Src) 98.6 F (37 C) (Oral)  Resp 16  Ht 6' 1.5" (1.867 m)  Wt 216 lb (97.977 kg)  BMI 28.11 kg/m2  SpO2 98%   Eyes: Conjunctivae are normal.  Pulmonary/Chest: Effort normal.  Neurological: He is alert and oriented to person, place, and time.  Psychiatric: He has a normal mood and affect. His speech is normal and behavior is normal.       Results for orders placed or performed in visit on 01/24/16  POCT glucose (manual entry)  Result Value Ref Range   POC Glucose 81 70 - 99 mg/dl  POCT glycosylated hemoglobin (Hb A1C)  Result Value Ref Range   Hemoglobin A1C 5.7        Assessment & Plan:   1. Attention deficit disorder (ADD) Restart Adderall. I suspect that his fatigue and anxiety will both resolve.  - amphetamine-dextroamphetamine (ADDERALL) 30 MG tablet; Take 1 PO QAM, 1/2 PO Qmidday, 1/2 PO QPM. May fill 60 days after date on prescription.  Dispense: 60 tablet; Refill: 0 - amphetamine-dextroamphetamine (ADDERALL) 30 MG tablet; Take 1 PO QAM, 1/2 PO Qmidday, 1/2 PO QPM.  Dispense: 60 tablet; Refill: 0 - amphetamine-dextroamphetamine (ADDERALL) 30 MG tablet; 1 PO QAM, 1/2 tab PO Qmidday, 1/2 tab PO QPM  Dispense: 60 tablet; Refill: 0  2. Anxiety Restart PRN Alprazolam. - ALPRAZolam (XANAX) 0.5 MG tablet; Take 1 tablet (0.5 mg total) by mouth daily as needed for anxiety.  Dispense: 30 tablet; Refill: 0 - ALPRAZolam (XANAX) 0.5 MG tablet; Take 1 tablet (0.5 mg total) by mouth daily as needed for anxiety.  Dispense: 30 tablet; Refill: 0 - ALPRAZolam (XANAX) 0.5 MG tablet; Take 1 tablet (0.5 mg total) by mouth daily as needed for anxiety.  Dispense: 30 tablet; Refill: 0  3. Raynaud's phenomenon Restart Cardizem CD - diltiazem (CARDIZEM CD) 240 MG 24 hr capsule; TAKE 1 CAPSULE BY MOUTH ONCE A DAY  Dispense: 90 capsule; Refill: 3  5. Polyuria Normal glucose, but borderline A1C. Reduce sugar in diet.  - POCT glucose (manual entry) - POCT glycosylated  hemoglobin (Hb A1C)  When he is insured, we'll update his fasting labs. While I anticipate his fatigue and polyuria will resolve, if not, will pursue additional evaluation.   Fernande Bras, PA-C Physician Assistant-Certified [facility]

## 2016-07-10 ENCOUNTER — Telehealth: Payer: Self-pay | Admitting: Physician Assistant

## 2016-07-10 ENCOUNTER — Telehealth: Payer: Self-pay

## 2016-07-10 DIAGNOSIS — F988 Other specified behavioral and emotional disorders with onset usually occurring in childhood and adolescence: Secondary | ICD-10-CM

## 2016-07-10 MED ORDER — AMPHETAMINE-DEXTROAMPHETAMINE 30 MG PO TABS: ORAL_TABLET | ORAL | 0 refills | Status: DC

## 2016-07-10 NOTE — Telephone Encounter (Signed)
Patient notified via My Chart.  Meds ordered this encounter  Medications  . amphetamine-dextroamphetamine (ADDERALL) 30 MG tablet    Sig: Take 1 PO QAM, 1/2 PO Qmidday, 1/2 PO QPM. May fill 60 days after date on prescription.    Dispense:  60 tablet    Refill:  0  . amphetamine-dextroamphetamine (ADDERALL) 30 MG tablet    Sig: Take 1 PO QAM, 1/2 PO Qmidday, 1/2 PO QPM.    Dispense:  60 tablet    Refill:  0  . amphetamine-dextroamphetamine (ADDERALL) 30 MG tablet    Sig: 1 PO QAM, 1/2 tab PO Qmidday, 1/2 tab PO QPM    Dispense:  60 tablet    Refill:  0    May fill 30 days after date on prescription

## 2016-07-10 NOTE — Telephone Encounter (Signed)
Pt is needing a refill on adderall   Best number [phone]

## 2016-09-07 MED FILL — AMPHETAMINE SALTS 30 MG TAB: 30 | 30 days supply | Qty: 60 | Fill #0

## 2016-10-26 MED FILL — AMPHETAMINE SALTS 30 MG TAB: 30 | 30 days supply | Qty: 60 | Fill #0

## 2016-12-17 ENCOUNTER — Encounter: Payer: Self-pay | Admitting: Physician Assistant

## 2016-12-17 ENCOUNTER — Other Ambulatory Visit: Payer: Self-pay | Admitting: Physician Assistant

## 2016-12-17 DIAGNOSIS — F988 Other specified behavioral and emotional disorders with onset usually occurring in childhood and adolescence: Secondary | ICD-10-CM

## 2016-12-18 NOTE — Telephone Encounter (Signed)
Please contact this patient. He needs to set up an appointment with me, and due to his work, needs a first morning appointment, either 102 or 104 are ok. Thank you!

## 2016-12-19 ENCOUNTER — Telehealth: Payer: Self-pay

## 2016-12-19 DIAGNOSIS — F988 Other specified behavioral and emotional disorders with onset usually occurring in childhood and adolescence: Secondary | ICD-10-CM

## 2016-12-19 MED ORDER — AMPHETAMINE-DEXTROAMPHETAMINE 30 MG PO TABS: ORAL_TABLET | ORAL | 0 refills | Status: DC

## 2016-12-19 NOTE — Telephone Encounter (Signed)
Pt is scheduled for 1/11. I informed him that you're willing to give a temp supply of meds until this visit. States that he only needs Adderall. Would like a call when this is ready to be picked up as he is currently out and lives outside of town. Understands that it must be picked up once filled.

## 2016-12-19 NOTE — Telephone Encounter (Signed)
Meds ordered this encounter  Medications  . amphetamine-dextroamphetamine (ADDERALL) 30 MG tablet    Sig: Take 1 PO QAM, 1/2 PO Qmidday, 1/2 PO QPM.    Dispense:  60 tablet    Refill:  0    Order Specific Question:   Supervising Provider    Answer:   SHAW, EVA N [[id number]]  . amphetamine-dextroamphetamine (ADDERALL) 30 MG tablet    Sig: Take 1 PO QAM, 1/2 PO Qmidday, 1/2 PO QPM. May fill 60 days after date on prescription.    Dispense:  60 tablet    Refill:  0    Order Specific Question:   Supervising Provider    Answer:   SHAW, EVA N [[id number]]  . amphetamine-dextroamphetamine (ADDERALL) 30 MG tablet    Sig: 1 PO QAM, 1/2 tab PO Qmidday, 1/2 tab PO QPM    Dispense:  60 tablet    Refill:  0    May fill 30 days after date on prescription    Order Specific Question:   Supervising Provider    Answer:   Sherren MochaSHAW, EVA N [[id number]]    Patient may pick up one Rx now. Hold the other two (for 30 and 60 days from now) until he comes in for his visit with me on 12/24/16.

## 2016-12-19 NOTE — Telephone Encounter (Signed)
Thank you, patient has been informed and picked up the one rx.

## 2016-12-21 MED FILL — AMPHETAMINE SALTS 30 MG TAB: 30 | 30 days supply | Qty: 60 | Fill #0

## 2016-12-24 ENCOUNTER — Ambulatory Visit (INDEPENDENT_AMBULATORY_CARE_PROVIDER_SITE_OTHER): Payer: 59 | Admitting: Physician Assistant

## 2016-12-24 ENCOUNTER — Ambulatory Visit (INDEPENDENT_AMBULATORY_CARE_PROVIDER_SITE_OTHER): Payer: 59

## 2016-12-24 VITALS — BP 159/91 | HR 92 | Temp 97.5°F | Ht 73.5 in | Wt 201.8 lb

## 2016-12-24 DIAGNOSIS — E785 Hyperlipidemia, unspecified: Secondary | ICD-10-CM

## 2016-12-24 DIAGNOSIS — M5442 Lumbago with sciatica, left side: Secondary | ICD-10-CM | POA: Diagnosis not present

## 2016-12-24 DIAGNOSIS — F988 Other specified behavioral and emotional disorders with onset usually occurring in childhood and adolescence: Secondary | ICD-10-CM | POA: Diagnosis not present

## 2016-12-24 DIAGNOSIS — I7300 Raynaud's syndrome without gangrene: Secondary | ICD-10-CM

## 2016-12-24 DIAGNOSIS — M545 Low back pain: Secondary | ICD-10-CM | POA: Diagnosis not present

## 2016-12-24 DIAGNOSIS — F419 Anxiety disorder, unspecified: Secondary | ICD-10-CM

## 2016-12-24 MED ORDER — ALPRAZOLAM 0.5 MG PO TABS: 0.5000 mg | ORAL_TABLET | Freq: Every day | ORAL | 0 refills | Status: DC | PRN

## 2016-12-24 MED ORDER — CYCLOBENZAPRINE HCL 10 MG PO TABS: 10.0000 mg | ORAL_TABLET | Freq: Three times a day (TID) | ORAL | 0 refills | Status: DC | PRN

## 2016-12-24 MED ORDER — PREDNISONE 20 MG PO TABS: ORAL_TABLET | ORAL | 0 refills | Status: DC

## 2016-12-24 MED ORDER — DILTIAZEM HCL ER COATED BEADS 240 MG PO CP24: ORAL_CAPSULE | ORAL | 3 refills | Status: DC

## 2016-12-24 MED FILL — CYCLOBENZAPRINE 10 MG TAB: 10 | 10 days supply | Qty: 30 | Fill #0

## 2016-12-24 MED FILL — predniSONE 20 MG TABS: 20 | 9 days supply | Qty: 18 | Fill #0

## 2016-12-24 MED FILL — CARTIA XT 240 MG CAPSULE: 240 | 90 days supply | Qty: 90 | Fill #0

## 2016-12-24 NOTE — Patient Instructions (Addendum)
It's really important that you use proper body mechanics and increase the core strength to reduce the liklihood of pain like this in the future.  The cyclobenzaprine causes drowsiness. When you are back at work, reserve it for bedtime.  STOP all NSAIDS while you are on the prednisone. That means: ibuprofen, naproxen, aspirin. You CAN take acetaminophen (Tylenol).  The prednisone may aggravate your anxiety. Be sure to take it in the MORNINGS.    IF you received an x-ray today, you will receive an invoice from [facility]. Please contact [facility] at [phone] with questions or concerns regarding your invoice.   IF you received labwork today, you will receive an invoice from [facility]. Please contact [facility] at [phone] with questions or concerns regarding your invoice.   Our billing staff will not be able to assist you with questions regarding bills from these companies.  You will be contacted with the lab results as soon as they are available. The fastest way to get your results is to activate your My Chart account. Instructions are located on the last page of this paperwork. If you have not heard from us regarding the results in 2 weeks, please contact this office.

## 2016-12-24 NOTE — Progress Notes (Signed)
Patient ID: Andrew Chimerahomas Fee Jr., male    DOB: 1981-01-31, 36 y.o.   MRN: [id number]  PCP: Porfirio Oarhelle Bree Heinzelman, PA-C  Chief Complaint  Patient presents with  . Follow-up     medication f/u  . Back Pain    X 5 days    Subjective:   Presents for evaluation of ADD, anxiety, Raynaud's, medication refills and 5 days of LEFT sided low back pain.  In general, he's been doing well. He was married in 05/2016 and is enjoying married life. Has gained some weight, but is working on losing it again.  ADD well controlled on current regimen. No ill effects of the stimulant.  Anxiety is much better, but still has difficulty at night getting his brain to turn off. Occasional panic attacks. Alprazolam works well.  Not fasting today.  Back pain like this has occurred before, but never been this severe or lasted this long. Rates 7/10 at it's worst. Shooting, brief pain with bending/twisting. Radiates into the LEFT buttock. No specific trauma or injury, was putting on his pants when it began this time. No loss of bowel or bladder control. No saddle anesthesia. No LE weakness. Pain with movement. Minimal to no pain with sitting still.    Review of Systems As above.    Patient Active Problem List   Diagnosis Date Noted  . Hyperlipidemia 02/16/2014  . Attention deficit disorder (ADD)   . Anxiety   . Raynaud's phenomenon      Prior to Admission medications   Medication Sig Start Date End Date Taking? Authorizing Provider  ALPRAZolam Prudy Feeler(XANAX) 0.5 MG tablet Take 1 tablet (0.5 mg total) by mouth daily as needed for anxiety. 01/24/16  Yes Taiquan Campanaro, PA-C  ALPRAZolam (XANAX) 0.5 MG tablet Take 1 tablet (0.5 mg total) by mouth daily as needed for anxiety. 01/24/16  Yes Holt Woolbright, PA-C  ALPRAZolam (XANAX) 0.5 MG tablet Take 1 tablet (0.5 mg total) by mouth daily as needed for anxiety. 01/24/16  Yes Dasani Crear, PA-C  amphetamine-dextroamphetamine (ADDERALL) 30 MG tablet Take 1 PO  QAM, 1/2 PO Qmidday, 1/2 PO QPM. 12/19/16  Yes Bonne Whack, PA-C  amphetamine-dextroamphetamine (ADDERALL) 30 MG tablet Take 1 PO QAM, 1/2 PO Qmidday, 1/2 PO QPM. May fill 60 days after date on prescription. 12/19/16  Yes Ireoluwa Grant, PA-C  amphetamine-dextroamphetamine (ADDERALL) 30 MG tablet 1 PO QAM, 1/2 tab PO Qmidday, 1/2 tab PO QPM 12/19/16  Yes Elisama Thissen, PA-C  diltiazem (CARDIZEM CD) 240 MG 24 hr capsule TAKE 1 CAPSULE BY MOUTH ONCE A DAY 01/24/16  Yes Jrue Jarriel, PA-C  Multiple Vitamins-Minerals (MULTIVITAMIN PO) Take 1 tablet by mouth daily.   Yes Historical Provider, MD     No Known Allergies     Objective:  Physical Exam  Constitutional: He is oriented to person, place, and time. He appears well-developed and well-nourished. He is active and cooperative. No distress.  BP (!) 159/91 (BP Location: Right Arm, Patient Position: Sitting, Cuff Size: Small)   Pulse 92   Temp 97.5 F (36.4 C) (Oral)   Ht 6' 1.5" (1.867 m)   Wt 201 lb 12.8 oz (91.5 kg)   SpO2 98%   BMI 26.26 kg/m   HENT:  Head: Normocephalic and atraumatic.  Right Ear: Hearing normal.  Left Ear: Hearing normal.  Eyes: Conjunctivae are normal. No scleral icterus.  Neck: Normal range of motion. Neck supple. No thyromegaly present.  Cardiovascular: Normal rate, regular rhythm and normal heart sounds.  Pulses:      Radial pulses are 2+ on the right side, and 2+ on the left side.  Pulmonary/Chest: Effort normal and breath sounds normal.  Musculoskeletal:       Thoracic back: Normal.       Lumbar back: He exhibits decreased range of motion, tenderness, bony tenderness and pain. He exhibits no swelling, no edema, no deformity, no laceration and no spasm.  Lymphadenopathy:       Head (right side): No tonsillar, no preauricular, no posterior auricular and no occipital adenopathy present.       Head (left side): No tonsillar, no preauricular, no posterior auricular and no occipital adenopathy present.    He  has no cervical adenopathy.       Right: No supraclavicular adenopathy present.       Left: No supraclavicular adenopathy present.  Neurological: He is alert and oriented to person, place, and time. No sensory deficit.  Skin: Skin is warm, dry and intact. No rash noted. No cyanosis or erythema. Nails show no clubbing.  Psychiatric: He has a normal mood and affect. His speech is normal and behavior is normal.       Dg Lumbar Spine Complete  Result Date: 12/24/2016 CLINICAL DATA:  Five days of low back pain without known injury. EXAM: LUMBAR SPINE - COMPLETE 4+ VIEW COMPARISON:  None in PACs FINDINGS: The lumbar vertebral bodies are preserved in height. The pedicles and transverse processes are intact. The disc space heights are well maintained. There is no spondylolisthesis. There is no significant facet joint hypertrophy. The observed portions of the sacrum are normal. IMPRESSION: There is no acute or significant chronic bony abnormality of the lumbar spine. Electronically Signed   By: David  [place] M.D.   On: 12/24/2016 09:22       Assessment & Plan:   1. Raynaud's phenomenon without gangrene Controlled. Continue lifestyle measures and diltiazem. - diltiazem (CARDIZEM CD) 240 MG 24 hr capsule; TAKE 1 CAPSULE BY MOUTH ONCE A DAY  Dispense: 90 capsule; Refill: 3  2. Attention deficit disorder (ADD) without hyperactivity Controlled. Rxs printed and available at the front desk. May call in 3 months for prescriptions. RTC in 6 months.  3. Anxiety Controlled. - ALPRAZolam (XANAX) 0.5 MG tablet; Take 1 tablet (0.5 mg total) by mouth daily as needed for anxiety.  Dispense: 30 tablet; Refill: 0 - ALPRAZolam (XANAX) 0.5 MG tablet; Take 1 tablet (0.5 mg total) by mouth daily as needed for anxiety.  Dispense: 30 tablet; Refill: 0 - ALPRAZolam (XANAX) 0.5 MG tablet; Take 1 tablet (0.5 mg total) by mouth daily as needed for anxiety.  Dispense: 30 tablet; Refill: 0  4. Hyperlipidemia, unspecified  hyperlipidemia type Not fasting. At 6 month follow-up plan fasting lipids and CMET.  5. Acute left-sided low back pain with left-sided sciatica Increase core strength. Body mechanics reviewed. Oral steroid taper. Muscle relaxer. If persists, would recommend PT. He indicates he'll contact a chiropractor. - DG Lumbar Spine Complete; Future - cyclobenzaprine (FLEXERIL) 10 MG tablet; Take 1 tablet (10 mg total) by mouth 3 (three) times daily as needed for muscle spasms.  Dispense: 30 tablet; Refill: 0 - predniSONE (DELTASONE) 20 MG tablet; Take 3 PO QAM x3days, 2 PO QAM x3days, 1 PO QAM x3days  Dispense: 18 tablet; Refill: 0   Fernande Bras, PA-C Physician Assistant-Certified [facility]

## 2016-12-24 NOTE — Progress Notes (Signed)
Patient ID: Andrew Chen., male    DOB: 01/05/1981, 36 y.o.   MRN: [id number]  PCP: Porfirio Oar, PA-C  Chief Complaint  Patient presents with  . Follow-up     medication f/u  . Back Pain    X 5 days    Subjective:   Presents for refill of his medications and for evaluation of back pain.  Pt is a 36yo male who presents with sharp lower back pain x5 days. He states that the pain came on suddenly as he was putting his underwear on Sunday morning, at that time he rates his pain as a 7/10. The pain is located along his spine and his left lower back and radiates into his left buttock. The pain is worst in the morning and at night and improves with rest and with careful movements throughout the day. The pain is aggravated the most by leaning forward or making any sudden motions. He took two days off of work and used a Civil engineer, contracting, ibuprofen, and Goodys powder with mild relief. Today, he says the pain has improved to a 4/10. He states that he has had similar back pain in the past, but never this intense or lasting this long. He denies any trauma or known mechanism of injury. He denies saddle anesthesia or loss of bowel or bladder function. Pt expresses worry over missing time at work and how it affects his coworkers ability to work and be paid.   Pt also presents for refill of his anxiety and Raynaud's medications. He states that he has been taking his medications as directed. He states that he is happy with his current regimen and his conditions seem stable at this time. Denies any new or worsening symptoms.   Review of Systems In addition to that stated in HPI above:  Const: Denies fever, chills, fatigue, or weight loss or gain. Pulm: Denies cough or SOB. CV: Denies chest pain or palpitations. Neuro: Denies numbness or tingling in his lower extremities.    Patient Active Problem List   Diagnosis Date Noted  . Hyperlipidemia 02/16/2014  . Attention deficit disorder (ADD)   .  Anxiety   . Raynaud's phenomenon      Prior to Admission medications   Medication Sig Start Date End Date Taking? Authorizing Provider  ALPRAZolam Prudy Feeler) 0.5 MG tablet Take 1 tablet (0.5 mg total) by mouth daily as needed for anxiety. 01/24/16  Yes Chelle Jeffery, PA-C  ALPRAZolam (XANAX) 0.5 MG tablet Take 1 tablet (0.5 mg total) by mouth daily as needed for anxiety. 01/24/16  Yes Chelle Jeffery, PA-C  ALPRAZolam (XANAX) 0.5 MG tablet Take 1 tablet (0.5 mg total) by mouth daily as needed for anxiety. 01/24/16  Yes Chelle Jeffery, PA-C  amphetamine-dextroamphetamine (ADDERALL) 30 MG tablet Take 1 PO QAM, 1/2 PO Qmidday, 1/2 PO QPM. 12/19/16  Yes Chelle Jeffery, PA-C  amphetamine-dextroamphetamine (ADDERALL) 30 MG tablet Take 1 PO QAM, 1/2 PO Qmidday, 1/2 PO QPM. May fill 60 days after date on prescription. 12/19/16  Yes Chelle Jeffery, PA-C  amphetamine-dextroamphetamine (ADDERALL) 30 MG tablet 1 PO QAM, 1/2 tab PO Qmidday, 1/2 tab PO QPM 12/19/16  Yes Chelle Jeffery, PA-C  diltiazem (CARDIZEM CD) 240 MG 24 hr capsule TAKE 1 CAPSULE BY MOUTH ONCE A DAY 01/24/16  Yes Chelle Jeffery, PA-C  Multiple Vitamins-Minerals (MULTIVITAMIN PO) Take 1 tablet by mouth daily.   Yes Historical Provider, MD     No Known Allergies     Objective:  Physical Exam Lymph: No cervical lymphadenopathy. MSK: Tenderness to palpation of spinous processes of lumbar spine and tenderness to palpation of left lower back. No radiation of pain to extremities with palpation. Limited and painful forward back flexion and back rotation. Extension and lateral flexion tolerated well. CV: 2+ pulses at posterior tibialis and dorsalis pedis bilaterally. Extremities: No pallor, poikilothermia, or discoloration of lower extremities. No pitting edema.         Assessment & Plan:   1. Raynaud's phenomenon without gangrene - diltiazem (CARDIZEM CD) 240 MG 24 hr capsule; TAKE 1 CAPSULE BY MOUTH ONCE A DAY  Dispense: 90 capsule; Refill:  3  2. Attention deficit disorder (ADD) without hyperactivity  3. Anxiety - ALPRAZolam (XANAX) 0.5 MG tablet; Take 1 tablet (0.5 mg total) by mouth daily as needed for anxiety.  Dispense: 30 tablet; Refill: 0 - ALPRAZolam (XANAX) 0.5 MG tablet; Take 1 tablet (0.5 mg total) by mouth daily as needed for anxiety.  Dispense: 30 tablet; Refill: 0 - ALPRAZolam (XANAX) 0.5 MG tablet; Take 1 tablet (0.5 mg total) by mouth daily as needed for anxiety.  Dispense: 30 tablet; Refill: 0  4. Hyperlipidemia, unspecified hyperlipidemia type Pt advised to return in 6 weeks for fasting labs.  5. Acute left-sided low back pain with left-sided sciatica Pt advised to practice good back hygiene. Pt states that he may make an appointment with a chiropractor. - DG Lumbar Spine Complete; Future - cyclobenzaprine (FLEXERIL) 10 MG tablet; Take 1 tablet (10 mg total) by mouth 3 (three) times daily as needed for muscle spasms.  Dispense: 30 tablet; Refill: 0 - predniSONE (DELTASONE) 20 MG tablet; Take 3 PO QAM x3days, 2 PO QAM x3days, 1 PO QAM x3days  Dispense: 18 tablet; Refill: 0    Georgiana SpinnerHannah Bradley Jacki Couse, PA-S

## 2016-12-28 DIAGNOSIS — M5387 Other specified dorsopathies, lumbosacral region: Secondary | ICD-10-CM | POA: Diagnosis not present

## 2016-12-28 DIAGNOSIS — M9904 Segmental and somatic dysfunction of sacral region: Secondary | ICD-10-CM | POA: Diagnosis not present

## 2016-12-28 DIAGNOSIS — M461 Sacroiliitis, not elsewhere classified: Secondary | ICD-10-CM | POA: Diagnosis not present

## 2016-12-28 DIAGNOSIS — M9905 Segmental and somatic dysfunction of pelvic region: Secondary | ICD-10-CM | POA: Diagnosis not present

## 2016-12-28 DIAGNOSIS — M5116 Intervertebral disc disorders with radiculopathy, lumbar region: Secondary | ICD-10-CM | POA: Diagnosis not present

## 2016-12-28 DIAGNOSIS — M9903 Segmental and somatic dysfunction of lumbar region: Secondary | ICD-10-CM | POA: Diagnosis not present

## 2017-01-12 DIAGNOSIS — Q760 Spina bifida occulta: Secondary | ICD-10-CM | POA: Diagnosis not present

## 2017-01-12 DIAGNOSIS — M47819 Spondylosis without myelopathy or radiculopathy, site unspecified: Secondary | ICD-10-CM | POA: Diagnosis not present

## 2017-02-03 MED FILL — AMPHETAMINE SALTS 30 MG TAB: 30 | 30 days supply | Qty: 60 | Fill #0

## 2017-02-03 MED FILL — ALPRAZolam 0.5 MG TABS: 0.5 | 30 days supply | Qty: 30 | Fill #0

## 2017-04-05 MED FILL — AMPHETAMINE SALTS 30 MG TAB: 30 | 30 days supply | Qty: 60 | Fill #0

## 2017-06-02 ENCOUNTER — Other Ambulatory Visit: Payer: Self-pay | Admitting: Physician Assistant

## 2017-06-02 DIAGNOSIS — M5442 Lumbago with sciatica, left side: Secondary | ICD-10-CM

## 2017-06-02 DIAGNOSIS — F419 Anxiety disorder, unspecified: Secondary | ICD-10-CM

## 2017-06-02 DIAGNOSIS — F988 Other specified behavioral and emotional disorders with onset usually occurring in childhood and adolescence: Secondary | ICD-10-CM

## 2017-06-02 MED ORDER — AMPHETAMINE-DEXTROAMPHETAMINE 30 MG PO TABS: ORAL_TABLET | ORAL | 0 refills | Status: DC

## 2017-06-02 MED ORDER — CYCLOBENZAPRINE HCL 10 MG PO TABS: 10.0000 mg | ORAL_TABLET | Freq: Three times a day (TID) | ORAL | 0 refills | Status: DC | PRN

## 2017-06-02 MED ORDER — ALPRAZOLAM 0.5 MG PO TABS: 0.5000 mg | ORAL_TABLET | Freq: Every day | ORAL | 0 refills | Status: DC | PRN

## 2017-06-02 NOTE — Telephone Encounter (Signed)
Patient notified via My Chart.  Meds ordered this encounter  Medications  . amphetamine-dextroamphetamine (ADDERALL) 30 MG tablet    Sig: Take 1 PO QAM, 1/2 PO Qmidday, 1/2 PO QPM.    Dispense:  60 tablet    Refill:  0  . ALPRAZolam (XANAX) 0.5 MG tablet    Sig: Take 1 tablet (0.5 mg total) by mouth daily as needed for anxiety.    Dispense:  30 tablet    Refill:  0  . cyclobenzaprine (FLEXERIL) 10 MG tablet    Sig: Take 1 tablet (10 mg total) by mouth 3 (three) times daily as needed for muscle spasms.    Dispense:  30 tablet    Refill:  0

## 2017-06-03 MED FILL — CYCLOBENZAPRINE 10 MG TAB: 10 | 10 days supply | Qty: 30 | Fill #0

## 2017-06-03 NOTE — Telephone Encounter (Signed)
Faxed  Pharmacy

## 2017-06-04 MED FILL — AMPHETAMINE SALTS 30 MG TAB: 30 | 30 days supply | Qty: 60 | Fill #0

## 2017-07-06 ENCOUNTER — Encounter: Payer: Self-pay | Admitting: Physician Assistant

## 2017-07-06 ENCOUNTER — Ambulatory Visit (INDEPENDENT_AMBULATORY_CARE_PROVIDER_SITE_OTHER): Payer: 59 | Admitting: Physician Assistant

## 2017-07-06 VITALS — BP 149/102 | HR 86 | Temp 98.3°F | Resp 18 | Ht 73.5 in | Wt 201.0 lb

## 2017-07-06 DIAGNOSIS — R030 Elevated blood-pressure reading, without diagnosis of hypertension: Secondary | ICD-10-CM | POA: Diagnosis not present

## 2017-07-06 DIAGNOSIS — R7989 Other specified abnormal findings of blood chemistry: Secondary | ICD-10-CM

## 2017-07-06 DIAGNOSIS — Z114 Encounter for screening for human immunodeficiency virus [HIV]: Secondary | ICD-10-CM

## 2017-07-06 DIAGNOSIS — Z23 Encounter for immunization: Secondary | ICD-10-CM | POA: Diagnosis not present

## 2017-07-06 DIAGNOSIS — E785 Hyperlipidemia, unspecified: Secondary | ICD-10-CM | POA: Diagnosis not present

## 2017-07-06 DIAGNOSIS — I7300 Raynaud's syndrome without gangrene: Secondary | ICD-10-CM | POA: Diagnosis not present

## 2017-07-06 DIAGNOSIS — M545 Low back pain, unspecified: Secondary | ICD-10-CM

## 2017-07-06 DIAGNOSIS — G8929 Other chronic pain: Secondary | ICD-10-CM | POA: Diagnosis not present

## 2017-07-06 DIAGNOSIS — Z6826 Body mass index (BMI) 26.0-26.9, adult: Secondary | ICD-10-CM | POA: Diagnosis not present

## 2017-07-06 DIAGNOSIS — B001 Herpesviral vesicular dermatitis: Secondary | ICD-10-CM | POA: Diagnosis not present

## 2017-07-06 DIAGNOSIS — F988 Other specified behavioral and emotional disorders with onset usually occurring in childhood and adolescence: Secondary | ICD-10-CM | POA: Diagnosis not present

## 2017-07-06 DIAGNOSIS — F419 Anxiety disorder, unspecified: Secondary | ICD-10-CM | POA: Diagnosis not present

## 2017-07-06 MED ORDER — AMPHETAMINE-DEXTROAMPHETAMINE 30 MG PO TABS: ORAL_TABLET | ORAL | 0 refills | Status: DC

## 2017-07-06 MED ORDER — ALPRAZOLAM 0.5 MG PO TABS: 0.5000 mg | ORAL_TABLET | Freq: Every day | ORAL | 0 refills | Status: DC | PRN

## 2017-07-06 MED ORDER — ACYCLOVIR 5 % EX CREA: 1.0000 "application " | TOPICAL_CREAM | CUTANEOUS | 0 refills | Status: DC

## 2017-07-06 NOTE — Patient Instructions (Addendum)
On December 24, 2016, I wrote the cardizem for #90, refill x 3. Ask them about it.  Keep doing the exercises for your back! Let me know if you decide that you are ready for physical therapy.    IF you received an x-ray today, you will receive an invoice from [facility]. Please contact [facility] at [phone] with questions or concerns regarding your invoice.   IF you received labwork today, you will receive an invoice from [facility]. Please contact [facility] at [phone] with questions or concerns regarding your invoice.   Our billing staff will not be able to assist you with questions regarding bills from these companies.  You will be contacted with the lab results as soon as they are available. The fastest way to get your results is to activate your My Chart account. Instructions are located on the last page of this paperwork. If you have not heard from us regarding the results in 2 weeks, please contact this office.

## 2017-07-06 NOTE — Progress Notes (Signed)
Patient ID: Andrew Chimerahomas Meador Jr., male    DOB: 09-Jul-1981, 36 y.o.   MRN: [id number]  PCP: Porfirio OarJeffery, Kedric Shaub, PA-C  Chief Complaint  Patient presents with  . Follow-up    ADD    Subjective:   Presents for evaluation of ADD.  Doing well. No problems with medication side effects. Feels like the current dose continues to work well.  In addition, he is fasting today so we can update his lipids. Recall that his father's first cardiac event was at the patient's present age.  Continues to have episodic back pain, grabs him for a few seconds, then resolves. No radicular symptoms. He did go see the chiropractor, who recommended a $3000 treatment plan, which the patient declined. 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    Review of Systems As above. No CP, SOB, HA, dizziness. No nausea, vomiting or diarrhea.    Patient Active Problem List   Diagnosis Date Noted  . Hyperlipidemia 02/16/2014  . Attention deficit disorder (ADD)   . Anxiety   . Raynaud's phenomenon      Prior to Admission medications   Medication Sig Start Date End Date Taking? Authorizing Provider  ALPRAZolam Prudy Feeler(XANAX) 0.5 MG tablet Take 1 tablet (0.5 mg total) by mouth daily as needed for anxiety. 12/24/16  Yes Sulamita Lafountain, PA-C  ALPRAZolam (XANAX) 0.5 MG tablet Take 1 tablet (0.5 mg total) by mouth daily as needed for anxiety. 12/24/16  Yes Tanayah Squitieri, PA-C  ALPRAZolam (XANAX) 0.5 MG tablet Take 1 tablet (0.5 mg total) by mouth daily as needed for anxiety. 06/02/17  Yes Kama Cammarano, PA-C  amphetamine-dextroamphetamine (ADDERALL) 30 MG tablet Take 1 PO QAM, 1/2 PO Qmidday, 1/2 PO QPM. May fill 60 days after date on prescription. 12/19/16  Yes Shenouda Genova, PA-C  amphetamine-dextroamphetamine (ADDERALL) 30 MG tablet 1 PO QAM, 1/2 tab PO Qmidday, 1/2 tab PO QPM 12/19/16  Yes Dennard Vezina, PA-C    amphetamine-dextroamphetamine (ADDERALL) 30 MG tablet Take 1 PO QAM, 1/2 PO Qmidday, 1/2 PO QPM. 06/02/17  Yes Cinch Ormond, PA-C  diltiazem (CARDIZEM CD) 240 MG 24 hr capsule TAKE 1 CAPSULE BY MOUTH ONCE A DAY 12/24/16  Yes Makaley Storts, PA-C  Multiple Vitamins-Minerals (MULTIVITAMIN PO) Take 1 tablet by mouth daily.   Yes [provider]     No Known Allergies     Objective:  Physical Exam  Constitutional: He is oriented to person, place, and time. He appears well-developed and well-nourished. He is active and cooperative. No distress.  BP (!) 149/102   Pulse 86   Temp 98.3 F (36.8 C) (Oral)   Resp 18   Ht 6' 1.5" (1.867 m)   Wt 201 lb (91.2 kg)   SpO2 96%   BMI 26.16 kg/m   HENT:  Head: Normocephalic and atraumatic.  Right Ear: Hearing normal.  Left Ear: Hearing normal.  Eyes: Conjunctivae are normal. No scleral icterus.  Neck: Normal range of motion. Neck supple. No thyromegaly present.  Cardiovascular: Normal rate, regular rhythm and normal heart sounds.   Pulses:      Radial pulses are 2+ on the right side, and 2+ on the left side.  Pulmonary/Chest: Effort normal and breath sounds normal.  Lymphadenopathy:       Head (right side): No tonsillar, no preauricular, no posterior auricular and no occipital adenopathy present.       Head (left side): No tonsillar,  no preauricular, no posterior auricular and no occipital adenopathy present.    He has no cervical adenopathy.       Right: No supraclavicular adenopathy present.       Left: No supraclavicular adenopathy present.  Neurological: He is alert and oriented to person, place, and time. No sensory deficit.  Skin: Skin is warm, dry and intact. No rash noted. No cyanosis or erythema. Nails show no clubbing.  Skin grafting changes are noted and unchanged.  Psychiatric: He has a normal mood and affect. His speech is normal and behavior is normal.           Assessment & Plan:   Problem List Items  Addressed This Visit    Hyperlipidemia - Primary (Chronic)    Await labs. Adjust regimen as indicated by results. Has previously declined prescription treatment due to cost.      Relevant Orders   Comprehensive metabolic panel (Completed)   CBC with Differential/Platelet (Completed)   Lipid panel (Completed)   Attention deficit disorder (ADD)    Stable. Controlled. Continue current treatment.      Relevant Medications   amphetamine-dextroamphetamine (ADDERALL) 30 MG tablet   amphetamine-dextroamphetamine (ADDERALL) 30 MG tablet   amphetamine-dextroamphetamine (ADDERALL) 30 MG tablet   Anxiety    Reasonably well controlled. Centers primarily on his father's cardiac disease and increased risk for his own. Father was the patient's present age when he had his first event. Continue efforts to maximize risk reduction.      Relevant Medications   ALPRAZolam (XANAX) 0.5 MG tablet   ALPRAZolam (XANAX) 0.5 MG tablet   ALPRAZolam (XANAX) 0.5 MG tablet   Other Relevant Orders   TSH (Completed)   T4, free (Completed)   Raynaud's phenomenon    Stable. Continue CCB.      BMI 26.0-26.9,adult    Healthy lifestyle changes recommended.       Other Visit Diagnoses    Fever blister       Relevant Medications   acyclovir cream (ZOVIRAX) 5 %   Elevated blood pressure, situational       Relevant Orders   Care order/instruction:   Testosterone,Free and Total   Screening for HIV (human immunodeficiency virus)       Relevant Orders   HIV antibody   Need for Tdap vaccination       Relevant Orders   Tdap vaccine greater than or equal to 7yo IM (Completed)   Chronic left-sided low back pain without sciatica           Return in about 6 months (around 01/06/2018) for re-evaluation of cholesterol, anxiety, ADD.   Fernande Bras, PA-C [facility]

## 2017-07-07 DIAGNOSIS — Z6826 Body mass index (BMI) 26.0-26.9, adult: Secondary | ICD-10-CM | POA: Insufficient documentation

## 2017-07-07 LAB — COMPREHENSIVE METABOLIC PANEL
A/G RATIO: 2.1 (ref 1.2–2.2)
ALT: 55 IU/L — AB (ref 0–44)
AST: 35 IU/L (ref 0–40)
Albumin: 4.9 g/dL (ref 3.5–5.5)
Alkaline Phosphatase: 50 IU/L (ref 39–117)
BILIRUBIN TOTAL: 0.3 mg/dL (ref 0.0–1.2)
BUN/Creatinine Ratio: 10 (ref 9–20)
BUN: 10 mg/dL (ref 6–20)
CALCIUM: 9.9 mg/dL (ref 8.7–10.2)
CHLORIDE: 97 mmol/L (ref 96–106)
CO2: 26 mmol/L (ref 20–29)
Creatinine, Ser: 0.98 mg/dL (ref 0.76–1.27)
GFR calc Af Amer: 113 mL/min/{1.73_m2} (ref >59)
GFR, EST NON AFRICAN AMERICAN: 98 mL/min/{1.73_m2} (ref >59)
GLUCOSE: 96 mg/dL (ref 65–99)
Globulin, Total: 2.3 g/dL (ref 1.5–4.5)
Potassium: 4.3 mmol/L (ref 3.5–5.2)
Sodium: 142 mmol/L (ref 134–144)
TOTAL PROTEIN: 7.2 g/dL (ref 6.0–8.5)

## 2017-07-07 LAB — CBC WITH DIFFERENTIAL/PLATELET
BASOS ABS: 0.1 10*3/uL (ref 0.0–0.2)
Basos: 2 %
EOS (ABSOLUTE): 0.4 10*3/uL (ref 0.0–0.4)
Eos: 6 %
HEMOGLOBIN: 15.7 g/dL (ref 13.0–17.7)
Hematocrit: 45.6 % (ref 37.5–51.0)
IMMATURE GRANS (ABS): 0 10*3/uL (ref 0.0–0.1)
IMMATURE GRANULOCYTES: 0 %
Lymphocytes Absolute: 1.7 10*3/uL (ref 0.7–3.1)
Lymphs: 32 %
MCH: 29 pg (ref 26.6–33.0)
MCHC: 34.4 g/dL (ref 31.5–35.7)
MCV: 84 fL (ref 79–97)
MONOCYTES: 11 %
Monocytes Absolute: 0.6 10*3/uL (ref 0.1–0.9)
NEUTROS PCT: 49 %
Neutrophils Absolute: 2.7 10*3/uL (ref 1.4–7.0)
PLATELETS: 268 10*3/uL (ref 150–379)
RBC: 5.41 x10E6/uL (ref 4.14–5.80)
RDW: 14.7 % (ref 12.3–15.4)
WBC: 5.5 10*3/uL (ref 3.4–10.8)

## 2017-07-07 LAB — TESTOSTERONE,FREE AND TOTAL
TESTOSTERONE FREE: 14.3 pg/mL (ref 8.7–25.1)
TESTOSTERONE: 241 ng/dL — AB (ref 264–916)

## 2017-07-07 LAB — LIPID PANEL
CHOLESTEROL TOTAL: 199 mg/dL (ref 100–199)
Chol/HDL Ratio: 4.1 ratio (ref 0.0–5.0)
HDL: 48 mg/dL (ref >39)
LDL CALC: 129 mg/dL — AB (ref 0–99)
TRIGLYCERIDES: 111 mg/dL (ref 0–149)
VLDL CHOLESTEROL CAL: 22 mg/dL (ref 5–40)

## 2017-07-07 LAB — HIV ANTIBODY (ROUTINE TESTING W REFLEX): HIV Screen 4th Generation wRfx: NONREACTIVE

## 2017-07-07 LAB — TSH: TSH: 2.38 u[IU]/mL (ref 0.450–4.500)

## 2017-07-07 LAB — T4, FREE: Free T4: 0.86 ng/dL (ref 0.82–1.77)

## 2017-07-07 NOTE — Assessment & Plan Note (Signed)
Reasonably well controlled. Centers primarily on his father's cardiac disease and increased risk for his own. Father was the patient's present age when he had his first event. Continue efforts to maximize risk reduction.

## 2017-07-07 NOTE — Assessment & Plan Note (Signed)
Healthy lifestyle changes recommended.

## 2017-07-07 NOTE — Assessment & Plan Note (Signed)
Await labs. Adjust regimen as indicated by results. Has previously declined prescription treatment due to cost.

## 2017-07-07 NOTE — Assessment & Plan Note (Signed)
Stable. Controlled. Continue current treatment. 

## 2017-07-07 NOTE — Addendum Note (Signed)
Addended by: Fernande BrasJEFFERY, Iriel Nason S on: 07/07/2017 02:46 PM   Modules accepted: Orders

## 2017-07-07 NOTE — Assessment & Plan Note (Signed)
Stable. Continue CCB.

## 2017-07-15 ENCOUNTER — Encounter: Payer: Self-pay | Admitting: Endocrinology

## 2017-07-15 ENCOUNTER — Ambulatory Visit (INDEPENDENT_AMBULATORY_CARE_PROVIDER_SITE_OTHER): Payer: 59 | Admitting: Endocrinology

## 2017-07-15 VITALS — BP 136/90 | HR 93 | Ht 73.5 in | Wt 203.0 lb

## 2017-07-15 DIAGNOSIS — R7989 Other specified abnormal findings of blood chemistry: Secondary | ICD-10-CM

## 2017-07-15 DIAGNOSIS — R61 Generalized hyperhidrosis: Secondary | ICD-10-CM

## 2017-07-15 MED FILL — CARTIA XT 240 MG CAPSULE: 240 | 90 days supply | Qty: 90 | Fill #1

## 2017-07-15 MED FILL — ZOVIRAX 5% CREAM: 5 | 15 days supply | Qty: 5 | Fill #0
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
## 2017-07-16 MED FILL — AMPHETAMINE SALTS 30 MG TAB: 30 | 30 days supply | Qty: 60 | Fill #0

## 2017-07-19 ENCOUNTER — Other Ambulatory Visit (INDEPENDENT_AMBULATORY_CARE_PROVIDER_SITE_OTHER): Payer: 59

## 2017-07-19 DIAGNOSIS — R7989 Other specified abnormal findings of blood chemistry: Secondary | ICD-10-CM

## 2017-07-19 LAB — LUTEINIZING HORMONE: LH: 5.37 m[IU]/mL (ref 1.50–9.30)

## 2017-07-20 LAB — PROLACTIN: PROLACTIN: 15.9 ng/mL — AB (ref 4.0–15.2)

## 2017-07-20 LAB — TESTOSTERONE, FREE, TOTAL, SHBG
SEX HORMONE BINDING: 19.8 nmol/L (ref 16.5–55.9)
Testosterone, Free: 11.6 pg/mL (ref 8.7–25.1)
Testosterone: 241 ng/dL — ABNORMAL LOW (ref 264–916)

## 2017-08-05 ENCOUNTER — Telehealth: Payer: Self-pay | Admitting: Endocrinology

## 2017-08-05 ENCOUNTER — Encounter: Payer: Self-pay | Admitting: Endocrinology

## 2017-08-05 NOTE — Telephone Encounter (Signed)
Please advise 

## 2017-08-05 NOTE — Telephone Encounter (Signed)
Patient is a bit upset that he has not heard back from Dr. Lucianne Muss since his new patient appointment. Call patient to advise on his health at [phone], okay to leave a detailed message.

## 2017-08-05 NOTE — Telephone Encounter (Signed)
We did miss reporting his lab results.   Please let him know that the free or active testosterone is still normal but as a trial to see if it will improve his symptoms I would like to prescribe clomiphene 2 mg, half tablet 3 times a week to help improve his pituitary gland and testicular function, please send prescription.  He will need to come back in 6 weeks with fasting labs and office visit after that

## 2017-08-06 ENCOUNTER — Other Ambulatory Visit: Payer: Self-pay

## 2017-08-06 MED ORDER — CLOMIPHENE CITRATE 50 MG PO TABS: 25.0000 mg | ORAL_TABLET | ORAL | 0 refills | Status: DC

## 2017-08-06 MED FILL — CLOMIPHENE CITRATE 50 MG TA: 50 | 42 days supply | Qty: 10 | Fill #0

## 2017-08-06 NOTE — Telephone Encounter (Signed)
Called patient and let him know the message from Dr. Lucianne Muss. I have scheduled him for 6 weeks labs and follow up appointment. I have also sent the Clomiphene 25 mg in to [facility] pharmacy for him. Patient will use MyChart for further messages if need be.

## 2017-08-18 MED FILL — AMPHETAMINE SALTS 30 MG TAB: 30 | 30 days supply | Qty: 60 | Fill #0

## 2017-08-18 MED FILL — ALPRAZolam 0.5 MG TABS: 0.5 | 30 days supply | Qty: 30 | Fill #0

## 2017-09-08 ENCOUNTER — Encounter: Payer: Self-pay | Admitting: Endocrinology

## 2017-09-13 ENCOUNTER — Telehealth: Payer: Self-pay | Admitting: Endocrinology

## 2017-09-13 NOTE — Telephone Encounter (Signed)
He is on a medication as discussed with the patient and we are going to evaluate the response with follow up of labs and office visit.  Can discuss more once the labs are done She can come in with him for the office visit.   We can move up his appointment if need be for lab and office visit

## 2017-09-13 NOTE — Telephone Encounter (Signed)
Patient's wife CJ called to get a better idea of the plan that Dr. Lucianne Muss has for the patient. The patient has expressed to her that he is confused about his plan of care and she is a nurse so, she feels that she can help him understand. Call patient's wife CJ back this week to advise at [phone].

## 2017-09-13 NOTE — Telephone Encounter (Signed)
Please advise 

## 2017-09-14 NOTE — Telephone Encounter (Signed)
Called patient and went over note from Dr. Lucianne Muss with CJ patients wife. She will try to come to his next appointment.

## 2017-09-22 ENCOUNTER — Other Ambulatory Visit (INDEPENDENT_AMBULATORY_CARE_PROVIDER_SITE_OTHER): Payer: 59

## 2017-09-22 ENCOUNTER — Other Ambulatory Visit: Payer: Self-pay | Admitting: Endocrinology

## 2017-09-22 DIAGNOSIS — R7989 Other specified abnormal findings of blood chemistry: Secondary | ICD-10-CM

## 2017-09-22 DIAGNOSIS — R61 Generalized hyperhidrosis: Secondary | ICD-10-CM | POA: Diagnosis not present

## 2017-09-22 DIAGNOSIS — R5383 Other fatigue: Secondary | ICD-10-CM

## 2017-09-22 LAB — SEDIMENTATION RATE: Sed Rate: 2 mm/hr (ref 0–15)

## 2017-09-22 LAB — CBC WITH DIFFERENTIAL/PLATELET
BASOS PCT: 1.8 % (ref 0.0–3.0)
Basophils Absolute: 0.1 10*3/uL (ref 0.0–0.1)
EOS ABS: 0.3 10*3/uL (ref 0.0–0.7)
Eosinophils Relative: 5.6 % — ABNORMAL HIGH (ref 0.0–5.0)
HEMATOCRIT: 47.9 % (ref 39.0–52.0)
HEMOGLOBIN: 15.8 g/dL (ref 13.0–17.0)
LYMPHS PCT: 31.4 % (ref 12.0–46.0)
Lymphs Abs: 1.9 10*3/uL (ref 0.7–4.0)
MCHC: 33 g/dL (ref 30.0–36.0)
MCV: 88.8 fl (ref 78.0–100.0)
Monocytes Absolute: 0.6 10*3/uL (ref 0.1–1.0)
Monocytes Relative: 9.3 % (ref 3.0–12.0)
NEUTROS ABS: 3.2 10*3/uL (ref 1.4–7.7)
Neutrophils Relative %: 51.9 % (ref 43.0–77.0)
Platelets: 245 10*3/uL (ref 150.0–400.0)
RBC: 5.39 Mil/uL (ref 4.22–5.81)
RDW: 14.3 % (ref 11.5–15.5)
WBC: 6.2 10*3/uL (ref 4.0–10.5)

## 2017-09-22 LAB — LUTEINIZING HORMONE: LH: 6.64 m[IU]/mL (ref 1.50–9.30)

## 2017-09-22 LAB — T4, FREE: FREE T4: 0.59 ng/dL — AB (ref 0.60–1.60)

## 2017-09-23 LAB — TESTOSTERONE, FREE, TOTAL, SHBG
SEX HORMONE BINDING: 16.9 nmol/L (ref 16.5–55.9)
Testosterone, Free: 24.5 pg/mL (ref 8.7–25.1)
Testosterone: 521 ng/dL (ref 264–916)

## 2017-09-24 ENCOUNTER — Ambulatory Visit: Payer: No Typology Code available for payment source | Admitting: Endocrinology

## 2017-09-25 LAB — INSULIN-LIKE GROWTH FACTOR
IGF-I, LC/MS: 84 ng/mL (ref 53–331)
Z-Score (Male): -1.1 SD (ref ?–2.0)

## 2017-09-29 ENCOUNTER — Encounter: Payer: Self-pay | Admitting: Physician Assistant

## 2017-09-29 ENCOUNTER — Encounter: Payer: Self-pay | Admitting: Endocrinology

## 2017-09-29 ENCOUNTER — Ambulatory Visit (INDEPENDENT_AMBULATORY_CARE_PROVIDER_SITE_OTHER): Payer: 59 | Admitting: Endocrinology

## 2017-09-29 VITALS — BP 152/94 | HR 90 | Ht 73.5 in | Wt 203.0 lb

## 2017-09-29 DIAGNOSIS — E038 Other specified hypothyroidism: Secondary | ICD-10-CM

## 2017-09-29 DIAGNOSIS — R5383 Other fatigue: Secondary | ICD-10-CM | POA: Diagnosis not present

## 2017-09-29 DIAGNOSIS — R7989 Other specified abnormal findings of blood chemistry: Secondary | ICD-10-CM | POA: Diagnosis not present

## 2017-09-29 DIAGNOSIS — R0989 Other specified symptoms and signs involving the circulatory and respiratory systems: Secondary | ICD-10-CM

## 2017-09-29 DIAGNOSIS — R61 Generalized hyperhidrosis: Secondary | ICD-10-CM

## 2017-09-29 MED ORDER — LEVOTHYROXINE SODIUM 25 MCG PO TABS: 25.0000 ug | ORAL_TABLET | Freq: Every day | ORAL | 3 refills | Status: DC

## 2017-09-29 MED FILL — DEXTROAMP-AMPHETAMIN 30 MG: 30 | 30 days supply | Qty: 60 | Fill #0

## 2017-09-29 NOTE — Patient Instructions (Signed)
Check BP at home  F/u with PCP   Stop Clomiphene
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
## 2017-10-21 MED FILL — CARTIA XT 240 MG CAPSULE: 240 | 90 days supply | Qty: 90 | Fill #2

## 2017-10-22 ENCOUNTER — Other Ambulatory Visit: Payer: No Typology Code available for payment source

## 2017-10-27 ENCOUNTER — Ambulatory Visit: Payer: No Typology Code available for payment source | Admitting: Endocrinology

## 2017-11-06 ENCOUNTER — Encounter: Payer: Self-pay | Admitting: Physician Assistant

## 2017-11-06 ENCOUNTER — Other Ambulatory Visit: Payer: Self-pay

## 2017-11-06 ENCOUNTER — Ambulatory Visit (INDEPENDENT_AMBULATORY_CARE_PROVIDER_SITE_OTHER): Payer: 59 | Admitting: Physician Assistant

## 2017-11-06 VITALS — BP 138/94 | HR 98 | Temp 98.4°F | Resp 18 | Ht 73.5 in | Wt 211.8 lb

## 2017-11-06 DIAGNOSIS — F419 Anxiety disorder, unspecified: Secondary | ICD-10-CM | POA: Diagnosis not present

## 2017-11-06 DIAGNOSIS — F988 Other specified behavioral and emotional disorders with onset usually occurring in childhood and adolescence: Secondary | ICD-10-CM

## 2017-11-06 DIAGNOSIS — B001 Herpesviral vesicular dermatitis: Secondary | ICD-10-CM

## 2017-11-06 DIAGNOSIS — G4700 Insomnia, unspecified: Secondary | ICD-10-CM | POA: Diagnosis not present

## 2017-11-06 DIAGNOSIS — R61 Generalized hyperhidrosis: Secondary | ICD-10-CM | POA: Diagnosis not present

## 2017-11-06 DIAGNOSIS — R5383 Other fatigue: Secondary | ICD-10-CM | POA: Diagnosis not present

## 2017-11-06 DIAGNOSIS — R232 Flushing: Secondary | ICD-10-CM | POA: Diagnosis not present

## 2017-11-06 MED ORDER — ALPRAZOLAM 0.5 MG PO TABS: 0.5000 mg | ORAL_TABLET | Freq: Every day | ORAL | 0 refills | Status: AC | PRN

## 2017-11-06 MED ORDER — ACYCLOVIR 5 % EX CREA: 1.0000 "application " | TOPICAL_CREAM | CUTANEOUS | 0 refills | Status: DC

## 2017-11-06 MED ORDER — ALPRAZOLAM 0.5 MG PO TABS: 0.5000 mg | ORAL_TABLET | Freq: Every day | ORAL | 0 refills | Status: DC | PRN

## 2017-11-06 MED ORDER — AMPHETAMINE-DEXTROAMPHETAMINE 30 MG PO TABS: ORAL_TABLET | ORAL | 0 refills | Status: DC

## 2017-11-06 MED ORDER — BUPROPION HCL ER (XL) 150 MG PO TB24: 150.0000 mg | ORAL_TABLET | Freq: Every day | ORAL | 3 refills | Status: AC

## 2017-11-06 MED ORDER — ALPRAZOLAM 0.5 MG PO TABS: 0.5000 mg | ORAL_TABLET | Freq: Every day | ORAL | 0 refills | Status: DC

## 2017-11-06 NOTE — Assessment & Plan Note (Signed)
Possibly caused by Adderall. Try addition of bupropion to reduce anxiety and possibly reduce attention issues, which may allow for Adderall reduction.

## 2017-11-06 NOTE — Assessment & Plan Note (Signed)
Totally uncontrolled and likely the primary driver of his sleep issue, possibly even the night sweats. Start bupropion. Continue PRN alprazolam.

## 2017-11-06 NOTE — Assessment & Plan Note (Signed)
Adderall may contribute. I suspect that anxiety is the primary etiology. Continue PRN alprazolam. Start bupropion. Sleep evaluation ordered.

## 2017-11-06 NOTE — Patient Instructions (Addendum)
Take the Wellbutrin XL in the morning. Continue to check your blood pressure.     IF you received an x-ray today, you will receive an invoice from [facility]. Please contact [facility] at [phone] with questions or concerns regarding your invoice.   IF you received labwork today, you will receive an invoice from [facility]. Please contact [facility] at [phone] with questions or concerns regarding your invoice.   Our billing staff will not be able to assist you with questions regarding bills from these companies.  You will be contacted with the lab results as soon as they are available. The fastest way to get your results is to activate your My Chart account. Instructions are located on the last page of this paperwork. If you have not heard from us regarding the results in 2 weeks, please contact this office.

## 2017-11-06 NOTE — Progress Notes (Signed)
Patient ID: Andrew Chimerahomas Hoppes Jr., male    DOB: 1981/07/27, 36 y.o.   MRN: [id number]  PCP: Porfirio OarJeffery, Levie Owensby, PA-C  Chief Complaint  Patient presents with  . Hypertension    follow up  . sleep problems    seeing next step     Subjective:   Presents for evaluation of HTN, poor sleep, continued fatigue and hot flashes/night sweats. He is accompanied by his wife.  Unclear etiology for his fatigue, night sweats (drenching, requiring change of bed linens, 1-2/week), frequent night time awakenings (which triggers anxiety and additional insomnia), mood swings. No erectile dysfunction. No difficulty falling asleep, and reports that he can go to sleep any time.  He was referred to endocrinology due to a low testosterone level.  Took Clomid briefly, about 5 weeks, and was uncomfortable that it's a drug for women. He did not express that to the prescriber at the time. He was then prescribed levothyroxine, but relates it was never sent to the pharmacy. He did not follow-up regarding that, nor to have the labs ordered. He feels that communication with that provider has been poor, and does not want to return.  Home BP for the past month 138-185/94-115. Pulse 84-96.   Was referred for sleep study, on the recommendation of endocrinology due to snoring. He doesn't snore consistently, only following activities that irritate his sinuses, so he did not want to schedule. No witnessed pauses in snoring/breathing by his wife.  Feels like his most recent fill of Adderall is a placebo. The pill looks different, and isn't effective. Having difficulty staying on task.  Review of Systems As above.    Patient Active Problem List   Diagnosis Date Noted  . BMI 26.0-26.9,adult 07/07/2017  . Hyperlipidemia 02/16/2014  . Attention deficit disorder (ADD)   . Anxiety   . Raynaud's phenomenon      Prior to Admission medications   Medication Sig Start Date End Date Taking? Authorizing Provider  acyclovir  cream (ZOVIRAX) 5 % Apply 1 application topically every 3 (three) hours. Patient taking differently: Apply 1 application topically as needed.  07/06/17  Yes Costa Jha, PA-C  ALPRAZolam (XANAX) 0.5 MG tablet Take 1 tablet (0.5 mg total) by mouth daily as needed for anxiety. 07/06/17  Yes Azra Abrell, PA-C  ALPRAZolam (XANAX) 0.5 MG tablet Take 1 tablet (0.5 mg total) by mouth daily as needed for anxiety. 07/06/17  Yes Terri Malerba, PA-C  ALPRAZolam (XANAX) 0.5 MG tablet Take 0.5 mg by mouth daily. As needed for anxiety   Yes [provider]  amphetamine-dextroamphetamine (ADDERALL) 30 MG tablet Take 1 PO QAM, 1/2 PO Qmidday, 1/2 PO QPM. May fill 60 days after date on prescription. 07/06/17  Yes Isabella Roemmich, PA-C  amphetamine-dextroamphetamine (ADDERALL) 30 MG tablet 1 PO QAM, 1/2 tab PO Qmidday, 1/2 tab PO QPM 07/06/17  Yes Rheagan Nayak, PA-C  amphetamine-dextroamphetamine (ADDERALL) 30 MG tablet Take 1 PO QAM, 1/2 PO Qmidday, 1/2 PO QPM. 07/06/17  Yes Kyonna Frier, PA-C  diltiazem (CARDIZEM CD) 240 MG 24 hr capsule TAKE 1 CAPSULE BY MOUTH ONCE A DAY 12/24/16  Yes Chrystle Murillo, PA-C  levothyroxine (SYNTHROID) 25 MCG tablet Take 1 tablet (25 mcg total) by mouth daily before breakfast. 09/29/17  No Reather LittlerKumar, Ajay, MD  Multiple Vitamins-Minerals (MULTIVITAMIN PO) Take 1 tablet by mouth daily.   Yes [provider]     No Known Allergies     Objective:  Physical Exam  Constitutional: He is oriented to  person, place, and time. He appears well-developed and well-nourished. He is active and cooperative. No distress.  BP (!) 138/94   Pulse 98   Temp 98.4 F (36.9 C) (Oral)   Resp 18   Ht 6' 1.5" (1.867 m)   Wt 211 lb 12.8 oz (96.1 kg)   SpO2 99%   BMI 27.56 kg/m    Eyes: Conjunctivae are normal.  Pulmonary/Chest: Effort normal.  Neurological: He is alert and oriented to person, place, and time.  Psychiatric: His speech is normal and behavior is normal.  Judgment and thought content normal. His mood appears anxious. Cognition and memory are normal. He exhibits a depressed mood.  Mood is almost agitated. Jumps from one topic to another, returns to the same topics repeatedly. Gets upset when his wife interrupts, though it appears that she is trying to redirect him, noticing that he's easily sidetracked. Repeatedly expresses frustration, hopelessness that things will improve.        Assessment & Plan:   Problem List Items Addressed This Visit    Attention deficit disorder (ADD) - Primary    Unclear whether the current tablets are ineffective. Advised he inquire at his pharmacy. Hope that improved sleep and reduction in anxiety symptoms will help reduce attention problems. Hope that bupropion also helps to address attention.      Relevant Medications   amphetamine-dextroamphetamine (ADDERALL) 30 MG tablet   amphetamine-dextroamphetamine (ADDERALL) 30 MG tablet   amphetamine-dextroamphetamine (ADDERALL) 30 MG tablet   Anxiety    Totally uncontrolled and likely the primary driver of his sleep issue, possibly even the night sweats. Start bupropion. Continue PRN alprazolam.      Relevant Medications   buPROPion (WELLBUTRIN XL) 150 MG 24 hr tablet   ALPRAZolam (XANAX) 0.5 MG tablet   ALPRAZolam (XANAX) 0.5 MG tablet   ALPRAZolam (XANAX) 0.5 MG tablet   Insomnia    Adderall may contribute. I suspect that anxiety is the primary etiology. Continue PRN alprazolam. Start bupropion. Sleep evaluation ordered.      Relevant Orders   Ambulatory referral to Sleep Studies   Prolactin   Creatinine, urine, 24 hour   TSH   T4, free   Chronic night sweats    Possibly caused by Adderall. Try addition of bupropion to reduce anxiety and possibly reduce attention issues, which may allow for Adderall reduction.      Relevant Orders   Prolactin   Creatinine, urine, 24 hour   TSH   T4, free    Other Visit Diagnoses    Fever blister       Relevant  Medications   acyclovir cream (ZOVIRAX) 5 %   Fatigue, unspecified type       Unclear etiology. Proceed with orders recommended by endocrinology.   Relevant Orders   Prolactin   Creatinine, urine, 24 hour   TSH   T4, free       Return in about 4 weeks (around 12/04/2017) for re-evalaution of mood.   Fernande Brashelle S. Zaccai Chavarin, PA-C Primary Care at [facility]

## 2017-11-06 NOTE — Assessment & Plan Note (Signed)
Unclear whether the current tablets are ineffective. Advised he inquire at his pharmacy. Hope that improved sleep and reduction in anxiety symptoms will help reduce attention problems. Hope that bupropion also helps to address attention.

## 2017-11-07 LAB — T4, FREE: Free T4: 0.74 ng/dL — ABNORMAL LOW (ref 0.82–1.77)

## 2017-11-07 LAB — PROLACTIN: Prolactin: 8.2 ng/mL (ref 4.0–15.2)

## 2017-11-07 LAB — TSH: TSH: 2.98 u[IU]/mL (ref 0.450–4.500)

## 2017-11-08 MED FILL — AMPHETAMINE SALTS 30 MG TAB: 30 | 30 days supply | Qty: 60 | Fill #0

## 2017-11-08 MED FILL — BUPROPION HCL XL 150 MG TAB: 150 | 90 days supply | Qty: 90 | Fill #0

## 2017-11-08 MED FILL — ZOVIRAX 5% CREAM: 5 | 15 days supply | Qty: 5 | Fill #0

## 2017-11-10 MED FILL — LEVOTHYROXINE 25 MCG TABLET: 25 | 30 days supply | Qty: 30 | Fill #0

## 2017-11-11 DIAGNOSIS — M5442 Lumbago with sciatica, left side: Secondary | ICD-10-CM | POA: Diagnosis not present

## 2017-11-11 MED FILL — DICLOFENAC SODIUM 75 MG TAB: 75 | 30 days supply | Qty: 60 | Fill #0

## 2017-11-13 DIAGNOSIS — M545 Low back pain: Secondary | ICD-10-CM | POA: Diagnosis not present

## 2017-12-20 ENCOUNTER — Encounter: Payer: Self-pay | Admitting: Neurology

## 2017-12-20 ENCOUNTER — Institutional Professional Consult (permissible substitution): Payer: 59 | Admitting: Neurology

## 2017-12-20 ENCOUNTER — Ambulatory Visit: Payer: 59 | Admitting: Neurology

## 2017-12-20 VITALS — BP 160/98 | HR 96 | Ht 74.0 in | Wt 204.0 lb

## 2017-12-20 DIAGNOSIS — R0683 Snoring: Secondary | ICD-10-CM

## 2017-12-20 DIAGNOSIS — G479 Sleep disorder, unspecified: Secondary | ICD-10-CM | POA: Diagnosis not present

## 2017-12-20 DIAGNOSIS — G478 Other sleep disorders: Secondary | ICD-10-CM | POA: Diagnosis not present

## 2017-12-20 DIAGNOSIS — G4719 Other hypersomnia: Secondary | ICD-10-CM
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
## 2017-12-20 NOTE — Patient Instructions (Addendum)
Thank you for choosing [facility] for your sleep related care!   It was nice to meet you today! I appreciate that you entrust me with your sleep related healthcare concerns. I hope, I was able to address at least some of your concerns today, and that I can help you feel reassured and also get better.    Here is what we discussed today and what we came up with as our plan for you:    Based on your symptoms and your exam I believe you may be at risk for obstructive sleep apnea or OSA, and I think we should proceed with a sleep study to determine whether you do or do not have OSA and how severe it is. If you have more than mild OSA, I want you to consider treatment with CPAP. Please remember, the risks and ramifications of moderate to severe obstructive sleep apnea or OSA are: Cardiovascular disease, including congestive heart failure, stroke, difficult to control hypertension, arrhythmias, and even type 2 diabetes has been linked to untreated OSA. Sleep apnea causes disruption of sleep and sleep deprivation in most cases, which, in turn, can cause recurrent headaches, problems with memory, mood, concentration, focus, and vigilance. Most people with untreated sleep apnea report excessive daytime sleepiness, which can affect their ability to drive. Please do not drive if you feel sleepy.   I will likely see you back after your sleep study to go over the test results and where to go from there. We will call you after your sleep study to advise about the results (most likely, you will hear from LakeviewKristen, my nurse) and to set up an appointment at the time, as necessary.    You can bring your sleep aid for the study.   Our sleep lab administrative assistant will call you to schedule your sleep study. If you don't hear back from her by about 2 weeks from now, please feel free to call her at [phone]. You can leave a message with your phone number and concerns, if you get the voicemail box.  She will call back as soon as possible.

## 2017-12-21 DIAGNOSIS — M545 Low back pain: Secondary | ICD-10-CM | POA: Diagnosis not present

## 2017-12-21 DIAGNOSIS — M5126 Other intervertebral disc displacement, lumbar region: Secondary | ICD-10-CM | POA: Diagnosis not present

## 2017-12-21 DIAGNOSIS — M5137 Other intervertebral disc degeneration, lumbosacral region: Secondary | ICD-10-CM | POA: Diagnosis not present

## 2017-12-30 MED FILL — AMPHETAMINE SALTS 30 MG TAB: 30 | 30 days supply | Qty: 60 | Fill #0

## 2018-01-04 ENCOUNTER — Telehealth: Payer: Self-pay | Admitting: Neurology

## 2018-01-04 NOTE — Telephone Encounter (Signed)
We have attempted to call the patient two times to schedule sleep study. Patient has been unavailable at the phone numbers we have on file, and has not returned our calls. At this time, we will send a letter asking patient to please contact the sleep lab.  °

## 2018-01-07 DIAGNOSIS — M545 Low back pain: Secondary | ICD-10-CM | POA: Diagnosis not present

## 2018-01-07 DIAGNOSIS — M5126 Other intervertebral disc displacement, lumbar region: Secondary | ICD-10-CM | POA: Diagnosis not present

## 2018-01-07 DIAGNOSIS — M5137 Other intervertebral disc degeneration, lumbosacral region: Secondary | ICD-10-CM | POA: Diagnosis not present

## 2018-01-07 DIAGNOSIS — M5442 Lumbago with sciatica, left side: Secondary | ICD-10-CM | POA: Diagnosis not present

## 2018-02-03 ENCOUNTER — Other Ambulatory Visit: Payer: Self-pay | Admitting: Physician Assistant

## 2018-02-03 DIAGNOSIS — M5126 Other intervertebral disc displacement, lumbar region: Secondary | ICD-10-CM | POA: Diagnosis not present

## 2018-02-03 DIAGNOSIS — M545 Low back pain: Secondary | ICD-10-CM | POA: Diagnosis not present

## 2018-02-03 DIAGNOSIS — M5137 Other intervertebral disc degeneration, lumbosacral region: Secondary | ICD-10-CM | POA: Diagnosis not present

## 2018-02-03 DIAGNOSIS — I7300 Raynaud's syndrome without gangrene: Secondary | ICD-10-CM

## 2018-02-03 MED FILL — AMPHETAMINE SALTS 30 MG TAB: 30 | 30 days supply | Qty: 60 | Fill #0

## 2018-02-03 MED FILL — CARTIA XT 240 MG CAPSULE: 240 | 90 days supply | Qty: 90 | Fill #0

## 2018-02-03 MED FILL — buPROPion HCL ER (XL) 150 M: 150 | 90 days supply | Qty: 90 | Fill #1

## 2018-02-03 NOTE — Telephone Encounter (Signed)
Cartia refill Last OV: 12/24/16 Last Refill:12/24/16 Pharmacy:[facility] Called pharmacy and [place] CD and [place] XT are the used interchangable

## 2018-03-17 ENCOUNTER — Encounter: Payer: Self-pay | Admitting: Physician Assistant

## 2018-04-19 ENCOUNTER — Other Ambulatory Visit: Payer: Self-pay | Admitting: Physician Assistant

## 2018-04-19 DIAGNOSIS — F988 Other specified behavioral and emotional disorders with onset usually occurring in childhood and adolescence: Secondary | ICD-10-CM

## 2018-04-19 MED ORDER — AMPHETAMINE-DEXTROAMPHETAMINE 30 MG PO TABS: ORAL_TABLET | ORAL | 0 refills | Status: DC

## 2018-04-19 MED ORDER — AMPHETAMINE-DEXTROAMPHETAMINE 30 MG PO TABS: ORAL_TABLET | ORAL | 0 refills | Status: AC

## 2018-04-19 MED FILL — AMPHETAMINE SALTS 30 MG TAB: 30 | 30 days supply | Qty: 60 | Fill #0

## 2018-04-19 NOTE — Telephone Encounter (Signed)
Rx sent electronically.  Meds ordered this encounter  Medications  . amphetamine-dextroamphetamine (ADDERALL) 30 MG tablet    Sig: Take 1 PO QAM, 1/2 PO Qmidday, 1/2 PO QPM. May fill 60 days after date on prescription.    Dispense:  60 tablet    Refill:  0  . amphetamine-dextroamphetamine (ADDERALL) 30 MG tablet    Sig: 1 PO QAM, 1/2 tab PO Qmidday, 1/2 tab PO QPM    Dispense:  60 tablet    Refill:  0    May fill 30 days after date on prescription  . amphetamine-dextroamphetamine (ADDERALL) 30 MG tablet    Sig: Take 1 PO QAM, 1/2 PO Qmidday, 1/2 PO QPM.    Dispense:  60 tablet    Refill:  0    Please let patient know that I have sent these. He will need to see me at [facility], or establish with one of my colleagues for the next set of prescriptions.

## 2018-05-11 MED FILL — BUPROPION HCL XL 150 MG TAB: 150 | 90 days supply | Qty: 90 | Fill #2

## 2018-05-11 MED FILL — CARTIA XT 240 MG CAPSULE: 240 | 90 days supply | Qty: 90 | Fill #1

## 2018-05-27 ENCOUNTER — Emergency Department (HOSPITAL_COMMUNITY): Payer: 59 | Admitting: Certified Registered Nurse Anesthetist

## 2018-05-27 ENCOUNTER — Other Ambulatory Visit: Payer: Self-pay

## 2018-05-27 ENCOUNTER — Encounter (HOSPITAL_COMMUNITY): Payer: Self-pay | Admitting: Emergency Medicine

## 2018-05-27 ENCOUNTER — Emergency Department (HOSPITAL_COMMUNITY): Payer: 59

## 2018-05-27 ENCOUNTER — Observation Stay (HOSPITAL_COMMUNITY)
Admission: EM | Admit: 2018-05-27 | Discharge: 2018-05-27 | Disposition: A | Discharge status: Home / Self Care | Payer: 59 | Attending: General Surgery | Admitting: General Surgery

## 2018-05-27 ENCOUNTER — Encounter (HOSPITAL_COMMUNITY)
Admission: EM | Disposition: A | Discharge status: Home / Self Care | Payer: Self-pay | Source: Home / Self Care | Attending: Emergency Medicine

## 2018-05-27 DIAGNOSIS — K37 Unspecified appendicitis: Secondary | ICD-10-CM | POA: Diagnosis not present

## 2018-05-27 DIAGNOSIS — F419 Anxiety disorder, unspecified: Secondary | ICD-10-CM | POA: Diagnosis not present

## 2018-05-27 DIAGNOSIS — I7300 Raynaud's syndrome without gangrene: Secondary | ICD-10-CM | POA: Diagnosis not present

## 2018-05-27 DIAGNOSIS — Z79899 Other long term (current) drug therapy: Secondary | ICD-10-CM | POA: Insufficient documentation

## 2018-05-27 DIAGNOSIS — K3580 Unspecified acute appendicitis: Secondary | ICD-10-CM

## 2018-05-27 DIAGNOSIS — R1111 Vomiting without nausea: Secondary | ICD-10-CM | POA: Diagnosis not present

## 2018-05-27 DIAGNOSIS — E785 Hyperlipidemia, unspecified: Secondary | ICD-10-CM | POA: Diagnosis not present

## 2018-05-27 DIAGNOSIS — R1031 Right lower quadrant pain: Secondary | ICD-10-CM | POA: Diagnosis not present

## 2018-05-27 DIAGNOSIS — K353 Acute appendicitis with localized peritonitis, without perforation or gangrene: Principal | ICD-10-CM | POA: Insufficient documentation

## 2018-05-27 HISTORY — PX: LAPAROSCOPIC APPENDECTOMY: SHX408

## 2018-05-27 HISTORY — PX: APPENDECTOMY: SHX54

## 2018-05-27 HISTORY — DX: Low back pain, unspecified: M54.50

## 2018-05-27 HISTORY — DX: Other chronic pain: G89.29

## 2018-05-27 HISTORY — DX: Low back pain: M54.5

## 2018-05-27 HISTORY — DX: Essential (primary) hypertension: I10

## 2018-05-27 LAB — BASIC METABOLIC PANEL
Anion gap: 9 (ref 5–15)
BUN: 10 mg/dL (ref 6–20)
CALCIUM: 9.6 mg/dL (ref 8.9–10.3)
CO2: 26 mmol/L (ref 22–32)
Chloride: 102 mmol/L (ref 101–111)
Creatinine, Ser: 0.93 mg/dL (ref 0.61–1.24)
GLUCOSE: 110 mg/dL — AB (ref 65–99)
Potassium: 4.3 mmol/L (ref 3.5–5.1)
Sodium: 137 mmol/L (ref 135–145)

## 2018-05-27 LAB — URINALYSIS, ROUTINE W REFLEX MICROSCOPIC
BILIRUBIN URINE: NEGATIVE
GLUCOSE, UA: NEGATIVE mg/dL
Hgb urine dipstick: NEGATIVE
KETONES UR: NEGATIVE mg/dL
LEUKOCYTES UA: NEGATIVE
Nitrite: NEGATIVE
PROTEIN: NEGATIVE mg/dL
Specific Gravity, Urine: 1.014 (ref 1.005–1.030)
pH: 7 (ref 5.0–8.0)

## 2018-05-27 LAB — CBC WITH DIFFERENTIAL/PLATELET
Abs Immature Granulocytes: 0 10*3/uL (ref 0.0–0.1)
BASOS PCT: 1 %
Basophils Absolute: 0.1 10*3/uL (ref 0.0–0.1)
EOS PCT: 3 %
Eosinophils Absolute: 0.4 10*3/uL (ref 0.0–0.7)
HCT: 48.4 % (ref 39.0–52.0)
Hemoglobin: 15.8 g/dL (ref 13.0–17.0)
Immature Granulocytes: 0 %
Lymphocytes Relative: 9 %
Lymphs Abs: 1.3 10*3/uL (ref 0.7–4.0)
MCH: 28.2 pg (ref 26.0–34.0)
MCHC: 32.6 g/dL (ref 30.0–36.0)
MCV: 86.4 fL (ref 78.0–100.0)
Monocytes Absolute: 1.1 10*3/uL — ABNORMAL HIGH (ref 0.1–1.0)
Monocytes Relative: 7 %
NEUTROS PCT: 80 %
Neutro Abs: 12.4 10*3/uL — ABNORMAL HIGH (ref 1.7–7.7)
PLATELETS: 283 10*3/uL (ref 150–400)
RBC: 5.6 MIL/uL (ref 4.22–5.81)
RDW: 13.6 % (ref 11.5–15.5)
WBC: 15.3 10*3/uL — AB (ref 4.0–10.5)

## 2018-05-27 SURGERY — APPENDECTOMY, LAPAROSCOPIC
Anesthesia: General | Site: Abdomen

## 2018-05-27 MED ORDER — DEXAMETHASONE SODIUM PHOSPHATE 10 MG/ML IJ SOLN
INTRAMUSCULAR | Status: DC | PRN
  Administered 2018-05-27: 10 mg via INTRAVENOUS

## 2018-05-27 MED ORDER — DEXAMETHASONE SODIUM PHOSPHATE 10 MG/ML IJ SOLN
INTRAMUSCULAR | Status: AC
  Filled 2018-05-27: qty 1

## 2018-05-27 MED ORDER — KETOROLAC TROMETHAMINE 30 MG/ML IJ SOLN
INTRAMUSCULAR | Status: AC
  Filled 2018-05-27: qty 1

## 2018-05-27 MED ORDER — SODIUM CHLORIDE 0.9 % IR SOLN
Status: DC | PRN
  Administered 2018-05-27: 1000 mL

## 2018-05-27 MED ORDER — ACETAMINOPHEN 500 MG PO TABS: 1000.0000 mg | ORAL_TABLET | Freq: Four times a day (QID) | ORAL | 0 refills | Status: AC

## 2018-05-27 MED ORDER — ACETAMINOPHEN 500 MG PO TABS
1000.0000 mg | ORAL_TABLET | Freq: Four times a day (QID) | ORAL | Status: DC
  Administered 2018-05-27: 1000 mg via ORAL
  Filled 2018-05-27: qty 2

## 2018-05-27 MED ORDER — 0.9 % SODIUM CHLORIDE (POUR BTL) OPTIME
TOPICAL | Status: DC | PRN
  Administered 2018-05-27: 1000 mL

## 2018-05-27 MED ORDER — SODIUM CHLORIDE 0.9 % IV SOLN
1.0000 g | Freq: Once | INTRAVENOUS | Status: AC
  Administered 2018-05-27: 1 g via INTRAVENOUS
  Filled 2018-05-27: qty 10

## 2018-05-27 MED ORDER — HYDRALAZINE HCL 20 MG/ML IJ SOLN: 10.0000 mg | INTRAMUSCULAR | Status: DC | PRN

## 2018-05-27 MED ORDER — FENTANYL CITRATE (PF) 100 MCG/2ML IJ SOLN
INTRAMUSCULAR | Status: DC
Start: 2018-05-27 — End: 2018-05-27
  Filled 2018-05-27: qty 2

## 2018-05-27 MED ORDER — FENTANYL CITRATE (PF) 250 MCG/5ML IJ SOLN
INTRAMUSCULAR | Status: AC
  Filled 2018-05-27: qty 5

## 2018-05-27 MED ORDER — KETOROLAC TROMETHAMINE 30 MG/ML IJ SOLN
30.0000 mg | Freq: Once | INTRAMUSCULAR | Status: AC
  Administered 2018-05-27: 30 mg via INTRAVENOUS
  Filled 2018-05-27: qty 1

## 2018-05-27 MED ORDER — SUGAMMADEX SODIUM 200 MG/2ML IV SOLN
INTRAVENOUS | Status: DC | PRN
  Administered 2018-05-27: 200 mg via INTRAVENOUS

## 2018-05-27 MED ORDER — DILTIAZEM HCL ER COATED BEADS 240 MG PO CP24
240.0000 mg | ORAL_CAPSULE | Freq: Every day | ORAL | Status: DC
  Administered 2018-05-27: 240 mg via ORAL
  Filled 2018-05-27: qty 1

## 2018-05-27 MED ORDER — PROPOFOL 10 MG/ML IV BOLUS
INTRAVENOUS | Status: DC | PRN
  Administered 2018-05-27: 200 mg via INTRAVENOUS

## 2018-05-27 MED ORDER — ONDANSETRON HCL 4 MG/2ML IJ SOLN: 4.0000 mg | Freq: Four times a day (QID) | INTRAMUSCULAR | Status: DC | PRN

## 2018-05-27 MED ORDER — LACTATED RINGERS IV SOLN
INTRAVENOUS | Status: DC | PRN
  Administered 2018-05-27: 11:00:00 via INTRAVENOUS

## 2018-05-27 MED ORDER — KETOROLAC TROMETHAMINE 30 MG/ML IJ SOLN
30.0000 mg | Freq: Three times a day (TID) | INTRAMUSCULAR | Status: DC
  Administered 2018-05-27: 30 mg via INTRAVENOUS

## 2018-05-27 MED ORDER — MORPHINE SULFATE (PF) 2 MG/ML IV SOLN: 2.0000 mg | INTRAVENOUS | Status: DC | PRN

## 2018-05-27 MED ORDER — FENTANYL CITRATE (PF) 100 MCG/2ML IJ SOLN
INTRAMUSCULAR | Status: DC | PRN
  Administered 2018-05-27 (×2): 100 ug via INTRAVENOUS
  Administered 2018-05-27: 50 ug via INTRAVENOUS

## 2018-05-27 MED ORDER — ENOXAPARIN SODIUM 40 MG/0.4ML ~~LOC~~ SOLN
40.0000 mg | SUBCUTANEOUS | Status: DC
Start: 2018-05-28 — End: 2018-05-27

## 2018-05-27 MED ORDER — OXYCODONE HCL 5 MG PO TABS: 5.0000 mg | ORAL_TABLET | Freq: Once | ORAL | Status: DC | PRN

## 2018-05-27 MED ORDER — ONDANSETRON HCL 4 MG/2ML IJ SOLN
INTRAMUSCULAR | Status: DC | PRN
  Administered 2018-05-27: 4 mg via INTRAVENOUS

## 2018-05-27 MED ORDER — ACETAMINOPHEN 325 MG PO TABS: 650.0000 mg | ORAL_TABLET | Freq: Four times a day (QID) | ORAL | Status: DC | PRN

## 2018-05-27 MED ORDER — MIDAZOLAM HCL 2 MG/2ML IJ SOLN
INTRAMUSCULAR | Status: AC
Start: 2018-05-27 — End: ?
  Filled 2018-05-27: qty 2

## 2018-05-27 MED ORDER — PROPOFOL 10 MG/ML IV BOLUS
INTRAVENOUS | Status: AC
  Filled 2018-05-27: qty 20

## 2018-05-27 MED ORDER — DIPHENHYDRAMINE HCL 25 MG PO CAPS: 25.0000 mg | ORAL_CAPSULE | Freq: Four times a day (QID) | ORAL | Status: DC | PRN

## 2018-05-27 MED ORDER — OXYCODONE HCL 5 MG PO TABS
5.0000 mg | ORAL_TABLET | ORAL | Status: DC | PRN
  Administered 2018-05-27: 10 mg via ORAL
  Filled 2018-05-27: qty 2

## 2018-05-27 MED ORDER — OXYCODONE HCL 5 MG/5ML PO SOLN: 5.0000 mg | Freq: Once | ORAL | Status: DC | PRN

## 2018-05-27 MED ORDER — LIDOCAINE HCL (CARDIAC) PF 100 MG/5ML IV SOSY
PREFILLED_SYRINGE | INTRAVENOUS | Status: DC | PRN
  Administered 2018-05-27: 40 mg via INTRAVENOUS

## 2018-05-27 MED ORDER — SUGAMMADEX SODIUM 200 MG/2ML IV SOLN
INTRAVENOUS | Status: AC
Start: 2018-05-27 — End: ?
  Filled 2018-05-27: qty 2

## 2018-05-27 MED ORDER — MIDAZOLAM HCL 5 MG/5ML IJ SOLN
INTRAMUSCULAR | Status: DC | PRN
  Administered 2018-05-27: 2 mg via INTRAVENOUS

## 2018-05-27 MED ORDER — DIPHENHYDRAMINE HCL 50 MG/ML IJ SOLN: 25.0000 mg | Freq: Four times a day (QID) | INTRAMUSCULAR | Status: DC | PRN

## 2018-05-27 MED ORDER — ONDANSETRON 4 MG PO TBDP: 4.0000 mg | ORAL_TABLET | Freq: Four times a day (QID) | ORAL | Status: DC | PRN

## 2018-05-27 MED ORDER — MORPHINE SULFATE (PF) 4 MG/ML IV SOLN
4.0000 mg | Freq: Once | INTRAVENOUS | Status: AC
  Administered 2018-05-27: 4 mg via INTRAVENOUS
  Filled 2018-05-27: qty 1

## 2018-05-27 MED ORDER — ACETAMINOPHEN 650 MG RE SUPP: 650.0000 mg | Freq: Four times a day (QID) | RECTAL | Status: DC | PRN

## 2018-05-27 MED ORDER — OXYCODONE HCL 5 MG PO TABS: 5.0000 mg | ORAL_TABLET | ORAL | 0 refills | Status: AC | PRN

## 2018-05-27 MED ORDER — SODIUM CHLORIDE 0.9 % IV BOLUS
1000.0000 mL | Freq: Once | INTRAVENOUS | Status: AC
  Administered 2018-05-27: 1000 mL via INTRAVENOUS

## 2018-05-27 MED ORDER — SUCCINYLCHOLINE CHLORIDE 20 MG/ML IJ SOLN
INTRAMUSCULAR | Status: DC | PRN
  Administered 2018-05-27: 120 mg via INTRAVENOUS

## 2018-05-27 MED ORDER — BUPIVACAINE-EPINEPHRINE 0.25% -1:200000 IJ SOLN
INTRAMUSCULAR | Status: DC | PRN
  Administered 2018-05-27: 20 mL

## 2018-05-27 MED ORDER — FENTANYL CITRATE (PF) 100 MCG/2ML IJ SOLN
25.0000 ug | INTRAMUSCULAR | Status: DC | PRN
  Administered 2018-05-27: 50 ug via INTRAVENOUS

## 2018-05-27 MED ORDER — IBUPROFEN 200 MG PO TABS: 200.0000 mg | ORAL_TABLET | Freq: Three times a day (TID) | ORAL | 0 refills | Status: AC | PRN

## 2018-05-27 MED ORDER — ROCURONIUM BROMIDE 100 MG/10ML IV SOLN
INTRAVENOUS | Status: DC | PRN
  Administered 2018-05-27: 40 mg via INTRAVENOUS

## 2018-05-27 MED ORDER — METRONIDAZOLE IN NACL 5-0.79 MG/ML-% IV SOLN
500.0000 mg | Freq: Once | INTRAVENOUS | Status: AC
  Administered 2018-05-27: 500 mg via INTRAVENOUS
  Filled 2018-05-27: qty 100

## 2018-05-27 MED ORDER — SODIUM CHLORIDE 0.9 % IV SOLN: INTRAVENOUS | Status: DC

## 2018-05-27 MED ORDER — ONDANSETRON HCL 4 MG/2ML IJ SOLN: 4.0000 mg | Freq: Once | INTRAMUSCULAR | Status: DC | PRN

## 2018-05-27 MED ORDER — ONDANSETRON HCL 4 MG/2ML IJ SOLN
4.0000 mg | Freq: Once | INTRAMUSCULAR | Status: AC
  Administered 2018-05-27: 4 mg via INTRAVENOUS
  Filled 2018-05-27: qty 2

## 2018-05-27 MED ORDER — BUPIVACAINE-EPINEPHRINE (PF) 0.25% -1:200000 IJ SOLN
INTRAMUSCULAR | Status: AC
  Filled 2018-05-27: qty 30

## 2018-05-27 MED ORDER — ONDANSETRON HCL 4 MG/2ML IJ SOLN
INTRAMUSCULAR | Status: AC
Start: 2018-05-27 — End: ?
  Filled 2018-05-27: qty 2

## 2018-05-27 MED ORDER — TRAMADOL HCL 50 MG PO TABS
50.0000 mg | ORAL_TABLET | Freq: Four times a day (QID) | ORAL | Status: DC | PRN
  Administered 2018-05-27: 50 mg via ORAL
  Filled 2018-05-27: qty 1

## 2018-05-27 MED FILL — DICLOFENAC SODIUM 75 MG TAB: 75 | 30 days supply | Qty: 60 | Fill #1

## 2018-05-27 MED FILL — oxyCODONE HCL 5 MG TABS: 5 | 2 days supply | Qty: 15 | Fill #0

## 2018-05-27 SURGICAL SUPPLY — 48 items
ADH SKN CLS APL DERMABOND .7 (GAUZE/BANDAGES/DRESSINGS) ×1
APPLIER CLIP ROT 10 11.4 M/L (STAPLE)
APR CLP MED LRG 11.4X10 (STAPLE)
BAG SPEC RTRVL 10 TROC 200 (ENDOMECHANICALS) ×1
BLADE CLIPPER SURG (BLADE) IMPLANT
CANISTER SUCT 3000ML PPV (MISCELLANEOUS) ×2 IMPLANT
CHLORAPREP W/TINT 26ML (MISCELLANEOUS) ×2 IMPLANT
CLIP APPLIE ROT 10 11.4 M/L (STAPLE) IMPLANT
COVER SURGICAL LIGHT HANDLE (MISCELLANEOUS) ×2 IMPLANT
CUTTER FLEX LINEAR 45M (STAPLE) ×2 IMPLANT
DERMABOND ADVANCED (GAUZE/BANDAGES/DRESSINGS) ×1
DERMABOND ADVANCED .7 DNX12 (GAUZE/BANDAGES/DRESSINGS) ×1 IMPLANT
ELECT REM PT RETURN 9FT ADLT (ELECTROSURGICAL) ×2
ELECTRODE REM PT RTRN 9FT ADLT (ELECTROSURGICAL) ×1 IMPLANT
GLOVE BIO SURGEON STRL SZ8 (GLOVE) ×2 IMPLANT
GLOVE BIOGEL PI IND STRL 8 (GLOVE) ×1 IMPLANT
GLOVE BIOGEL PI INDICATOR 8 (GLOVE) ×1
GOWN STRL REUS W/ TWL LRG LVL3 (GOWN DISPOSABLE) ×2 IMPLANT
GOWN STRL REUS W/ TWL XL LVL3 (GOWN DISPOSABLE) ×1 IMPLANT
GOWN STRL REUS W/TWL LRG LVL3 (GOWN DISPOSABLE) ×4
GOWN STRL REUS W/TWL XL LVL3 (GOWN DISPOSABLE) ×2
GRASPER SUT TROCAR 14GX15 (MISCELLANEOUS) ×1 IMPLANT
KIT BASIN OR (CUSTOM PROCEDURE TRAY) ×2 IMPLANT
KIT TURNOVER KIT B (KITS) ×2 IMPLANT
NEEDLE 22X1 1/2 (OR ONLY) (NEEDLE) ×2 IMPLANT
NS IRRIG 1000ML POUR BTL (IV SOLUTION) ×2 IMPLANT
PAD ARMBOARD 7.5X6 YLW CONV (MISCELLANEOUS) ×4 IMPLANT
POUCH RETRIEVAL ECOSAC 10 (ENDOMECHANICALS) ×1 IMPLANT
POUCH RETRIEVAL ECOSAC 10MM (ENDOMECHANICALS) ×1
RELOAD 45 VASCULAR/THIN (ENDOMECHANICALS) ×2 IMPLANT
RELOAD STAPLE 45 2.5 WHT GRN (ENDOMECHANICALS) IMPLANT
RELOAD STAPLE 45 3.5 BLU ETS (ENDOMECHANICALS) IMPLANT
RELOAD STAPLE TA45 3.5 REG BLU (ENDOMECHANICALS) IMPLANT
SCISSORS LAP 5X35 DISP (ENDOMECHANICALS) IMPLANT
SET IRRIG TUBING LAPAROSCOPIC (IRRIGATION / IRRIGATOR) ×2 IMPLANT
SHEARS HARMONIC ACE PLUS 36CM (ENDOMECHANICALS) ×2 IMPLANT
SPECIMEN JAR SMALL (MISCELLANEOUS) ×2 IMPLANT
SUT VIC AB 4-0 PS2 27 (SUTURE) ×2 IMPLANT
SUT VICRYL 0 UR6 27IN ABS (SUTURE) ×1 IMPLANT
TOWEL OR 17X24 6PK STRL BLUE (TOWEL DISPOSABLE) ×2 IMPLANT
TOWEL OR 17X26 10 PK STRL BLUE (TOWEL DISPOSABLE) ×2 IMPLANT
TRAY FOLEY CATH SILVER 16FR (SET/KITS/TRAYS/PACK) ×2 IMPLANT
TRAY LAPAROSCOPIC MC (CUSTOM PROCEDURE TRAY) ×2 IMPLANT
TROCAR XCEL 12X100 BLDLESS (ENDOMECHANICALS) ×2 IMPLANT
TROCAR XCEL BLUNT TIP 100MML (ENDOMECHANICALS) ×2 IMPLANT
TROCAR XCEL NON-BLD 5MMX100MML (ENDOMECHANICALS) ×2 IMPLANT
TUBING INSUFFLATION (TUBING) ×2 IMPLANT
WATER STERILE IRR 1000ML POUR (IV SOLUTION) ×2 IMPLANT

## 2018-05-27 NOTE — Anesthesia Procedure Notes (Signed)
Procedure Name: Intubation Date/Time: 05/27/2018 10:55 AM Performed by: Shirlyn Goltz, CRNA Pre-anesthesia Checklist: Patient identified, Emergency Drugs available, Suction available and Patient being monitored Patient Re-evaluated:Patient Re-evaluated prior to induction Oxygen Delivery Method: Circle system utilized Preoxygenation: Pre-oxygenation with 100% oxygen Induction Type: IV induction, Rapid sequence and Cricoid Pressure applied Laryngoscope Size: Mac and 4 Grade View: Grade I Tube type: Oral Tube size: 7.5 mm Number of attempts: 1 Airway Equipment and Method: Stylet Placement Confirmation: ETT inserted through vocal cords under direct vision,  positive ETCO2 and breath sounds checked- equal and bilateral Secured at: 21 cm Tube secured with: Tape Dental Injury: Teeth and Oropharynx as per pre-operative assessment
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
## 2018-05-27 NOTE — Discharge Instructions (Signed)

## 2018-05-27 NOTE — Anesthesia Preprocedure Evaluation (Signed)
Anesthesia Evaluation  Patient identified by MRN, date of birth, ID band Patient awake    Reviewed: Allergy & Precautions, NPO status , Patient's Chart, lab work & pertinent test results  Airway Mallampati: II  TM Distance: >3 FB Neck ROM: Full    Dental  (+) Teeth Intact, Dental Advisory Given   Pulmonary former smoker,    breath sounds clear to auscultation       Cardiovascular  Rhythm:Regular Rate:Normal     Neuro/Psych    GI/Hepatic   Endo/Other    Renal/GU      Musculoskeletal   Abdominal   Peds  Hematology   Anesthesia Other Findings   Reproductive/Obstetrics                             Anesthesia Physical Anesthesia Plan  ASA: II and emergent  Anesthesia Plan: General   Post-op Pain Management:    Induction: Intravenous, Cricoid pressure planned and Rapid sequence  PONV Risk Score and Plan:   Airway Management Planned: Oral ETT  Additional Equipment:   Intra-op Plan:   Post-operative Plan: Extubation in OR  Informed Consent: I have reviewed the patients History and Physical, chart, labs and discussed the procedure including the risks, benefits and alternatives for the proposed anesthesia with the patient or authorized representative who has indicated his/her understanding and acceptance.   Dental advisory given  Plan Discussed with: CRNA and Anesthesiologist  Anesthesia Plan Comments:         Anesthesia Quick Evaluation

## 2018-05-27 NOTE — ED Notes (Signed)
Wife is taking patient belongings.

## 2018-05-27 NOTE — Op Note (Signed)
05/27/2018  11:35 AM  PATIENT:  Andrew Chimerahomas Gloria Jr.  37 y.o. male  PRE-OPERATIVE DIAGNOSIS:  Appendicitis  POST-OPERATIVE DIAGNOSIS:  Appendicitis  PROCEDURE:  Procedure(s): APPENDECTOMY LAPAROSCOPIC  SURGEON:  Surgeon(s): Violeta Gelinashompson, Carlester Kasparek, MD  ASSISTANTS: Myrtie SomanSharon Hitchcock, RNFA   ANESTHESIA:   local and general  EBL:  Total I/O In: 1100 [IV Piggyback:1100] Out: 55 [Urine:50; Blood:5]  BLOOD ADMINISTERED:none  DRAINS: none   SPECIMEN:  Excision  DISPOSITION OF SPECIMEN:  PATHOLOGY  COUNTS:  YES  DICTATION: .Dragon Dictation Findings: Acute suppurative appendicitis without perforation  Procedure in detail: Maisie Fushomas is brought for appendectomy.  He was identified in the preop holding area.  He received intravenous antibiotics.  Informed consent was obtained.  He was brought to the operating room and general endotracheal anesthesia was administered by anesthesia staff.The infraumbilical region was infiltrated with local. Supraumbilical incision was made. Subcutaneous tissues were dissected down revealing the anterior fascia. This was divided sharply along the midline. Peritoneal cavity was entered under direct vision without complication. A 0 Vicryl pursestring was placed around the fascial opening. Hassan trocar was inserted into the abdomen. The abdomen was insufflated with carbon dioxide in standard fashion. Under direct vision a 12 mm left lower quadrant and a 5 mm right-sided abdominal port was placed.  Local was used at each port site.  Microscopic expiration revealed an inflamed and separative appendicitis without perforation.  The mesoappendix was taken down using the harmonic scalpel achieving excellent hemostasis.  The base of the appendix was then divided with Endo GIA using a vascular load.  There was excellent staple line closure.  The appendix was placed in a bag and removed from the abdomen.  It was sent to pathology.  The abdomen was irrigated.  The staple line remained  intact and there was no bleeding.  I removed the left lower quadrant port and I closed the port site with 0 Vicryl and suture passer device.  The remainder of the ports were removed.  Pneumoperitoneum was released.  The supraumbilical fascia was closed by tying the pursestring.  All 3 wounds were irrigated and the skin which was closed with running 4-0 Vicryl followed by Dermabond.  All counts were correct.  He tolerated the procedure well without apparent complication was taken recovery in stable condition.  PATIENT DISPOSITION:  PACU - hemodynamically stable.   Delay start of Pharmacological VTE agent (>24hrs) due to surgical blood loss or risk of bleeding:  no  Violeta GelinasBurke Errik Mitchelle, MD, MPH, FACS Pager: [phone]  6/14/201911:35 AM

## 2018-05-27 NOTE — Progress Notes (Signed)
Pt doing well after Oxycodone, feels much better and pain better manageable. Indicated it is not as painful, just sore now.  Pt did get up and walk the halls earlier today, and sat up in the chair.  He has been up to the [facility] - several times this afternoon. Discharge instructions reviewed with Pt and spouse.  He has his discharge instructions and paperwork - Wife went and picked up medication from pharmacy earlier today. .Marland Kitchen

## 2018-05-27 NOTE — ED Triage Notes (Signed)
  Patient come in after having severe RLQ pain that started around 11pm.  Pain worsened throughout the night.  Patient had several episodes of emesis before coming into the ED.  Patient states pain is 9/10.

## 2018-05-27 NOTE — Discharge Summary (Signed)
Patient ID: Andrew Chimerahomas Blaisdell Jr. [id number] Jun 17, 1981 37 y.o.  Admit date: 05/27/2018 Discharge date: 05/27/2018  Admitting Diagnosis: Acute appendicitis  Discharge Diagnosis Patient Active Problem List   Diagnosis Date Noted  . Appendicitis 05/27/2018  . Insomnia 11/06/2017  . Chronic night sweats 11/06/2017  . BMI 26.0-26.9,adult 07/07/2017  . Hyperlipidemia 02/16/2014  . Attention deficit disorder (ADD)   . Anxiety   . Raynaud's phenomenon     Consultants none  Reason for Admission: Pt is a 37 yo male with a history of anxiety, ADD, raynaud's, who presented to the ED with complaints of RLQ abdominal pain since roughly 2000 yesterday. Pain progressively worsened to severe, constant, non radiating pain in his RLQ. Associated nausea and vomiting. No diarrhea, fever or chills. Wife at bedside and she is a Radiographer, therapeuticACU nurse. Pain medicine given in ED helped pain. No other associated symptoms. CT showed uncomplicated acute appendicitis and WBC is 15.3.  Procedures Laparoscopic appendectomy  Hospital Course:  The patient was admitted and underwent a laparoscopic appendectomy.  The patient tolerated the procedure well.  On POD 0, the patient was tolerating a regular diet, voiding well, mobilizing, and pain was controlled with oral pain medications.  The patient was stable for DC home at this time with appropriate follow up made.     Physical Exam: Abd: soft, tender in LLQ at one of his port sites, appropriately so, other sites are c/d/i, +BS, ND  Allergies as of 05/27/2018   No Known Allergies     Medication List    TAKE these medications   acetaminophen 500 MG tablet Commonly known as:  TYLENOL Take 2 tablets (1,000 mg total) by mouth every 6 (six) hours.   ALPRAZolam 0.5 MG tablet Commonly known as:  XANAX Take 1 tablet (0.5 mg total) by mouth daily as needed for anxiety. What changed:  Another medication with the same name was removed. Continue taking this medication,  and follow the directions you see here.   amphetamine-dextroamphetamine 30 MG tablet Commonly known as:  ADDERALL 1 PO QAM, 1/2 tab PO Qmidday, 1/2 tab PO QPM What changed:    how much to take  how to take this  when to take this  additional instructions  Another medication with the same name was removed. Continue taking this medication, and follow the directions you see here.   buPROPion 150 MG 24 hr tablet Commonly known as:  WELLBUTRIN XL Take 1 tablet (150 mg total) by mouth daily.   cetirizine 10 MG tablet Commonly known as:  ZYRTEC Take 10 mg by mouth at bedtime.   diclofenac 75 MG EC tablet Commonly known as:  VOLTAREN Take 75 mg by mouth 2 (two) times daily as needed for mild pain.   diltiazem 240 MG 24 hr capsule Commonly known as:  CARTIA XT TAKE 1 CAPSULE BY MOUTH ONCE A DAY   FISH OIL PO Take 1 capsule by mouth at bedtime.   ibuprofen 200 MG tablet Commonly known as:  MOTRIN IB Take 1-4 tablets (200-800 mg total) by mouth every 8 (eight) hours as needed.   multivitamin with minerals Tabs tablet Take 1 tablet by mouth at bedtime.   oxyCODONE 5 MG immediate release tablet Commonly known as:  Oxy IR/ROXICODONE Take 1-2 tablets (5-10 mg total) by mouth every 4 (four) hours as needed for moderate pain.        Follow-up Information    [facility] [place].   Specialty:  General  Surgery Why:   at 10:30 am. Please arrive 30 mintues prior to complete paperwork. Please bring photo ID and insurance card Contact information: [address] [place] [address] [phone]          Signed: Barnetta Chapel, [facility] 05/27/2018, 3:52 PM Pager: [phone]

## 2018-05-27 NOTE — Anesthesia Postprocedure Evaluation (Signed)
Anesthesia Post Note  Patient: Andrew Chimerahomas Hofstra Jr.  Procedure(s) Performed: APPENDECTOMY LAPAROSCOPIC (N/A Abdomen)     Patient location during evaluation: PACU Anesthesia Type: General Level of consciousness: awake and alert Pain management: pain level controlled Vital Signs Assessment: post-procedure vital signs reviewed and stable Respiratory status: spontaneous breathing, nonlabored ventilation, respiratory function stable and patient connected to nasal cannula oxygen Cardiovascular status: blood pressure returned to baseline and stable Postop Assessment: no apparent nausea or vomiting Anesthetic complications: no    Last Vitals:  Vitals:   05/27/18 1245 05/27/18 1312  BP:  (!) 134/100  Pulse: 75 79  Resp: 15 14  Temp:  36.7 C  SpO2: 90% 94%    Last Pain:  Vitals:   05/27/18 1312  TempSrc: Oral  PainSc:                  Aleatha Taite COKER
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
## 2018-05-27 NOTE — Transfer of Care (Signed)
Immediate Anesthesia Transfer of Care Note  Patient: Andrew Chimerahomas July Jr.  Procedure(s) Performed: APPENDECTOMY LAPAROSCOPIC (N/A Abdomen)  Patient Location: PACU  Anesthesia Type:General  Level of Consciousness: awake, alert , oriented and patient cooperative  Airway & Oxygen Therapy: Patient Spontanous Breathing and Patient connected to nasal cannula oxygen  Post-op Assessment: Report given to RN and Post -op Vital signs reviewed and stable  Post vital signs: Reviewed and stable  Last Vitals:  Vitals Value Taken Time  BP 129/99 05/27/2018 11:49 AM  Temp 36.6 C 05/27/2018 11:49 AM  Pulse 86 05/27/2018 11:51 AM  Resp 13 05/27/2018 11:51 AM  SpO2 94 % 05/27/2018 11:51 AM  Vitals shown include unvalidated device data.  Last Pain:  Vitals:   05/27/18 0721  TempSrc:   PainSc: 3          Complications: No apparent anesthesia complications

## 2018-05-28 ENCOUNTER — Encounter (HOSPITAL_COMMUNITY): Payer: Self-pay | Admitting: General Surgery

## 2018-07-06 MED FILL — AMPHETAMINE SALTS 30 MG TAB: 30 | 30 days supply | Qty: 60 | Fill #0

## 2018-08-30 MED FILL — CARTIA XT 240 MG CAPSULE: 240 | 90 days supply | Qty: 90 | Fill #2

## 2018-08-30 MED FILL — buPROPion HCL ER (XL) 150 M: 150 | 90 days supply | Qty: 90 | Fill #3

## 2018-08-30 MED FILL — DICLOFENAC SODIUM 75 MG TAB: 75 | 30 days supply | Qty: 60 | Fill #2

## 2018-08-31 MED FILL — ALPRAZolam 0.5 MG TABS: 0.5 | 30 days supply | Qty: 30 | Fill #0

## 2018-09-09 DIAGNOSIS — E785 Hyperlipidemia, unspecified: Secondary | ICD-10-CM | POA: Diagnosis not present

## 2018-09-09 DIAGNOSIS — G4700 Insomnia, unspecified: Secondary | ICD-10-CM | POA: Diagnosis not present

## 2018-09-09 DIAGNOSIS — M545 Low back pain: Secondary | ICD-10-CM | POA: Diagnosis not present

## 2018-09-09 DIAGNOSIS — F988 Other specified behavioral and emotional disorders with onset usually occurring in childhood and adolescence: Secondary | ICD-10-CM | POA: Diagnosis not present

## 2018-09-09 DIAGNOSIS — G8929 Other chronic pain: Secondary | ICD-10-CM | POA: Diagnosis not present

## 2018-09-09 DIAGNOSIS — R0609 Other forms of dyspnea: Secondary | ICD-10-CM | POA: Diagnosis not present

## 2018-09-09 DIAGNOSIS — Z23 Encounter for immunization: Secondary | ICD-10-CM | POA: Diagnosis not present

## 2018-09-09 MED FILL — PROAIR RESPICLICK INHAL PWD: 108 (90 BAS | 25 days supply | Qty: 1 | Fill #0

## 2018-09-09 MED FILL — AMPHETAMINE SALTS 30 MG TAB: 30 | 30 days supply | Qty: 60 | Fill #0

## 2018-10-19 MED FILL — AMPHETAMINE SALTS 30 MG TAB: 30 | 30 days supply | Qty: 60 | Fill #0

## 2018-11-14 MED FILL — DICLOFENAC SODIUM 75 MG TAB: 75 | 30 days supply | Qty: 60 | Fill #0

## 2018-12-02 DIAGNOSIS — M5442 Lumbago with sciatica, left side: Secondary | ICD-10-CM | POA: Diagnosis not present

## 2018-12-02 DIAGNOSIS — F419 Anxiety disorder, unspecified: Secondary | ICD-10-CM | POA: Diagnosis not present

## 2018-12-02 DIAGNOSIS — F988 Other specified behavioral and emotional disorders with onset usually occurring in childhood and adolescence: Secondary | ICD-10-CM | POA: Diagnosis not present

## 2018-12-02 DIAGNOSIS — G8929 Other chronic pain: Secondary | ICD-10-CM | POA: Diagnosis not present

## 2018-12-02 DIAGNOSIS — R0609 Other forms of dyspnea: Secondary | ICD-10-CM | POA: Diagnosis not present

## 2018-12-02 DIAGNOSIS — I7300 Raynaud's syndrome without gangrene: Secondary | ICD-10-CM | POA: Diagnosis not present

## 2018-12-02 MED FILL — buPROPion HCL ER (XL) 150 M: 150 | 30 days supply | Qty: 30 | Fill #0

## 2018-12-02 MED FILL — AMPHETAMINE SALTS 30 MG TAB: 30 | 30 days supply | Qty: 60 | Fill #0

## 2018-12-02 MED FILL — CARTIA XT 240 MG CAPSULE: 240 | 30 days supply | Qty: 30 | Fill #0

## 2018-12-11 IMAGING — DX DG LUMBAR SPINE COMPLETE 4+V
5 series · 5 of 5 positions shown · non-contrast
Comparison: None in PACs

CLINICAL DATA: Five days of low back pain without known injury.

EXAM:
LUMBAR SPINE - COMPLETE 4+ VIEW

[l-spine ap]
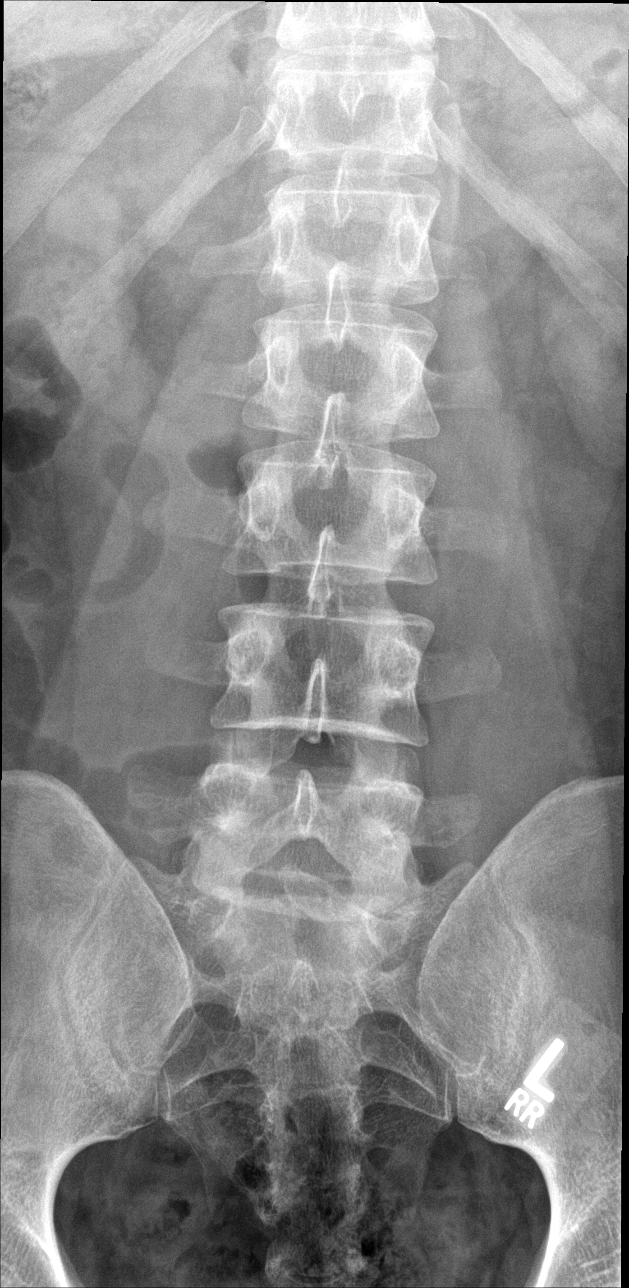

[l-spine obl (1 of 2)]
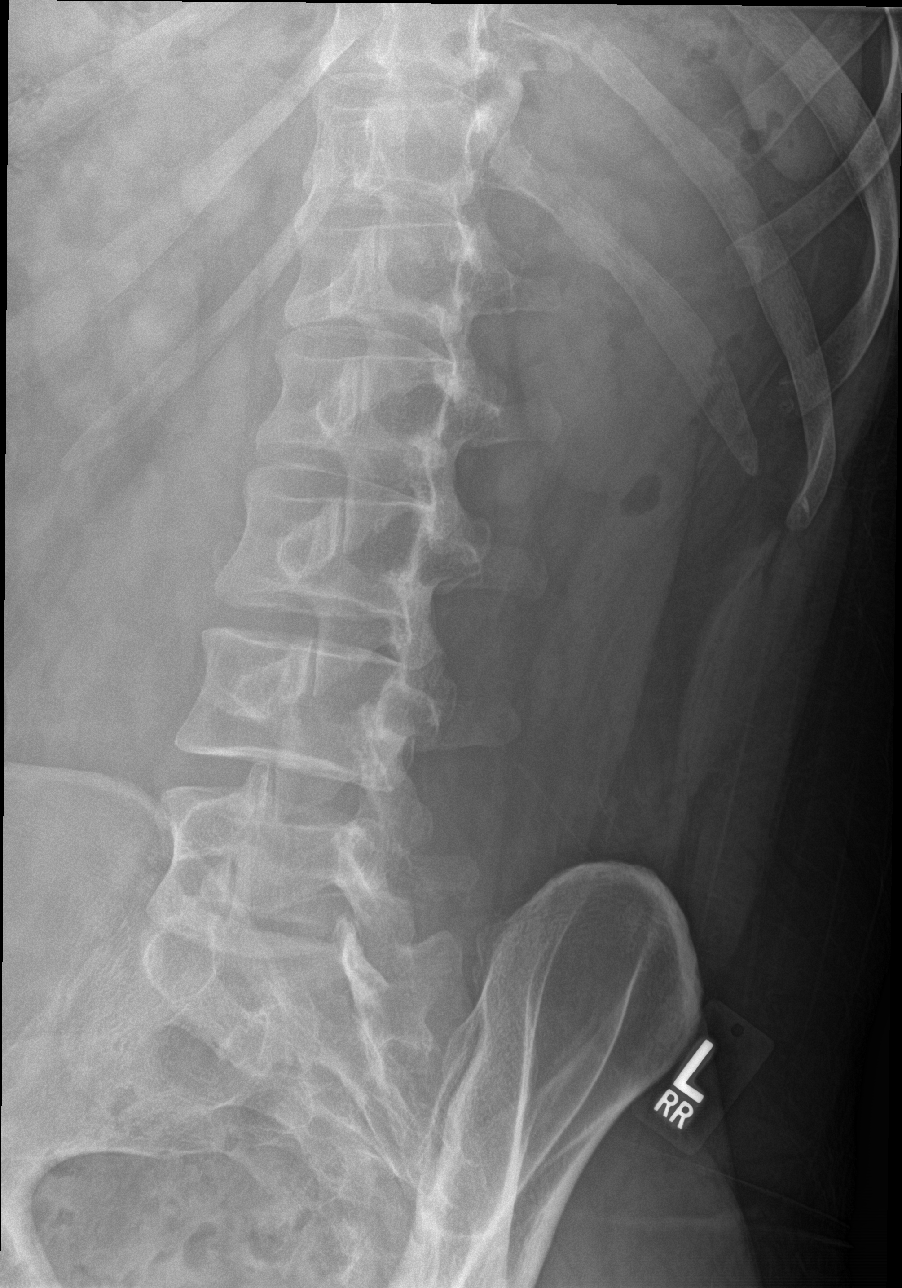

[l-spine obl (2 of 2)]
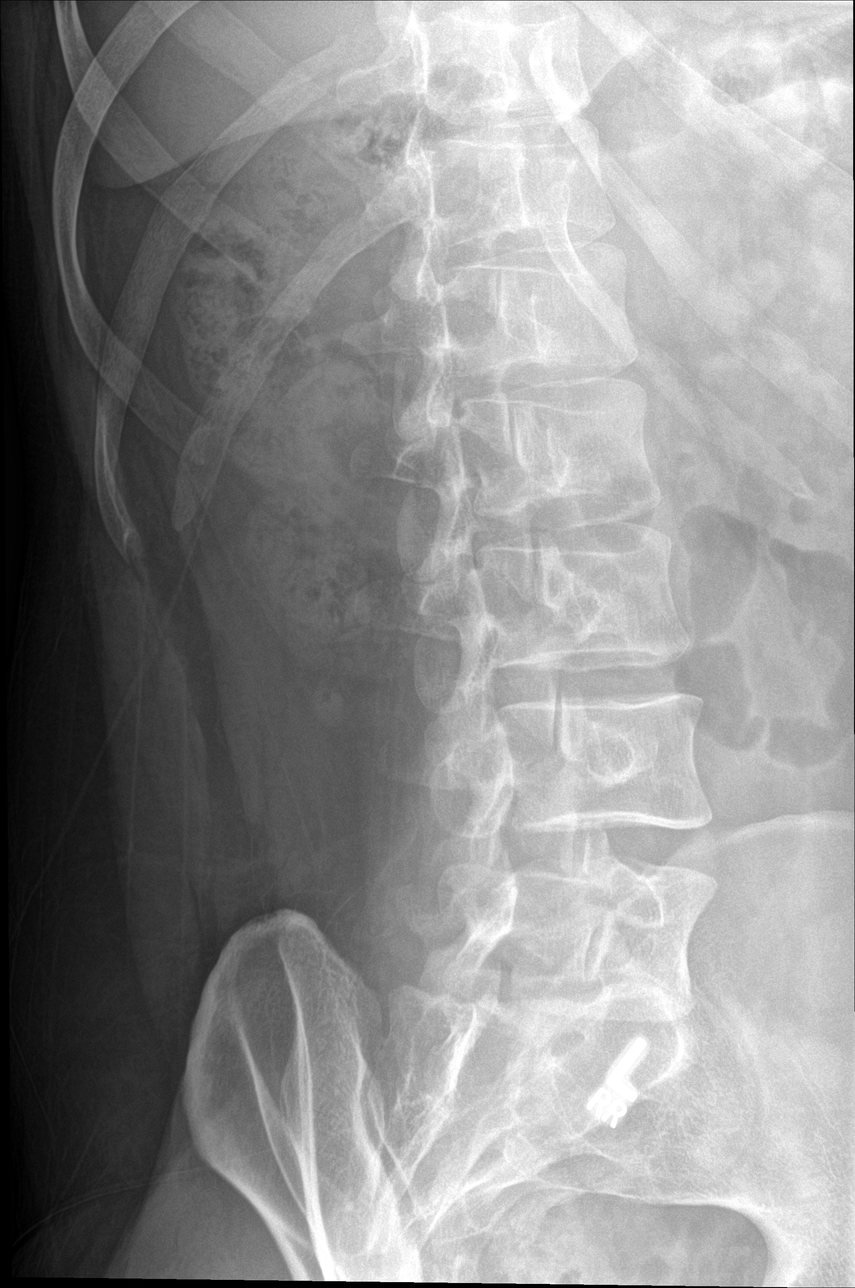

[l-spine lat]
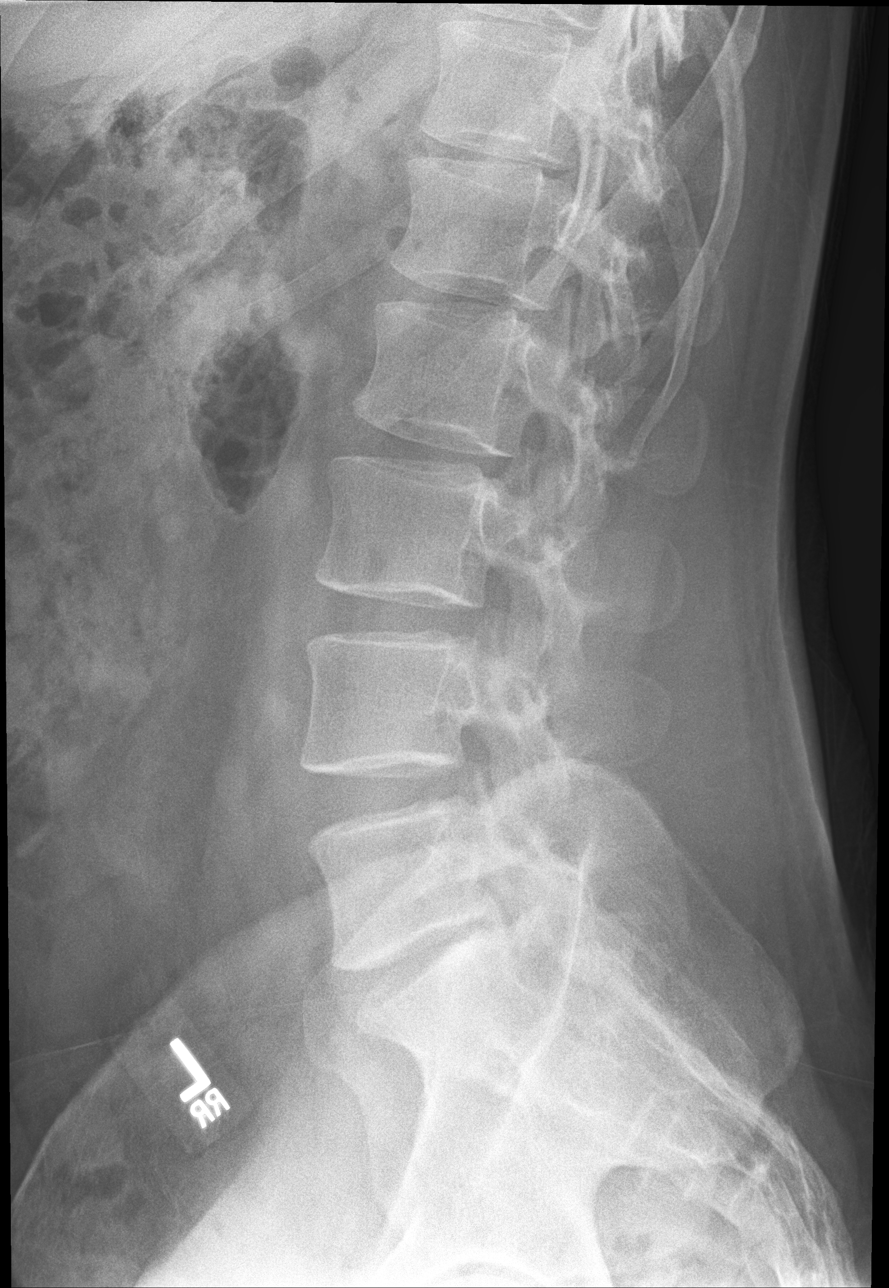

[l-spine l5-s1]
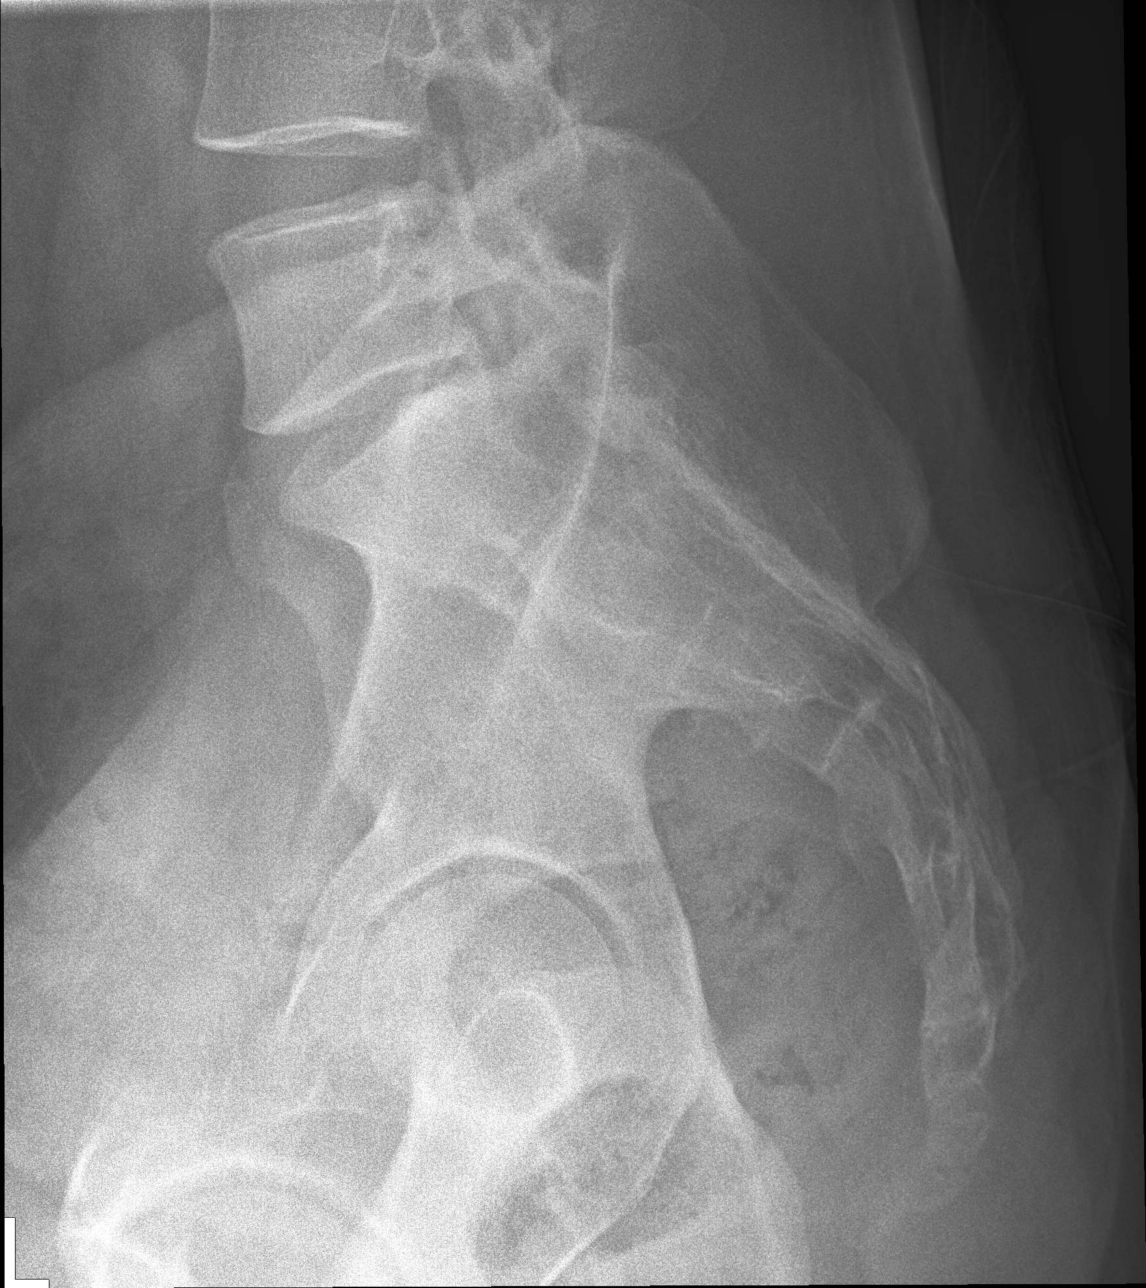

[5 of 5 positions shown; findings below may reference images not displayed]

FINDINGS: The lumbar vertebral bodies are preserved in height. The pedicles
and transverse processes are intact. The disc space heights are well
maintained. There is no spondylolisthesis. There is no significant
facet joint hypertrophy. The observed portions of the sacrum are
normal.
IMPRESSION: There is no acute or significant chronic bony abnormality of the
lumbar spine.

## 2019-01-05 MED FILL — CARTIA XT 240 MG CAPSULE: 240 | 90 days supply | Qty: 90 | Fill #3

## 2019-01-05 MED FILL — buPROPion HCL ER (XL) 150 M: 150 | 30 days supply | Qty: 30 | Fill #1

## 2019-01-05 MED FILL — AMPHETAMINE SALTS 30 MG TAB: 30 | 30 days supply | Qty: 60 | Fill #0

## 2019-01-14 DIAGNOSIS — S335XXA Sprain of ligaments of lumbar spine, initial encounter: Secondary | ICD-10-CM | POA: Diagnosis not present

## 2019-01-14 DIAGNOSIS — S5002XA Contusion of left elbow, initial encounter: Secondary | ICD-10-CM | POA: Diagnosis not present

## 2019-01-14 DIAGNOSIS — S138XXA Sprain of joints and ligaments of other parts of neck, initial encounter: Secondary | ICD-10-CM | POA: Diagnosis not present

## 2019-01-16 MED FILL — BACLOFEN 10 MG TABLET: 10 | 7 days supply | Qty: 21 | Fill #0

## 2019-01-16 MED FILL — DICLOFENAC SODIUM 75 MG TAB: 75 | 30 days supply | Qty: 60 | Fill #1 | Status: TO

## 2019-02-16 MED FILL — buPROPion HCL ER (XL) 150 M: 150 | 30 days supply | Qty: 30 | Fill #2 | Status: TO

## 2019-03-14 DIAGNOSIS — F902 Attention-deficit hyperactivity disorder, combined type: Secondary | ICD-10-CM | POA: Diagnosis not present

## 2019-03-14 DIAGNOSIS — F419 Anxiety disorder, unspecified: Secondary | ICD-10-CM | POA: Diagnosis not present

## 2019-03-14 DIAGNOSIS — I7300 Raynaud's syndrome without gangrene: Secondary | ICD-10-CM | POA: Diagnosis not present

## 2019-03-14 DIAGNOSIS — J3089 Other allergic rhinitis: Secondary | ICD-10-CM | POA: Diagnosis not present

## 2019-03-14 DIAGNOSIS — F988 Other specified behavioral and emotional disorders with onset usually occurring in childhood and adolescence: Secondary | ICD-10-CM | POA: Diagnosis not present

## 2019-03-15 MED FILL — AMPHETAMINE-DEXTROAMPHETAMI: 30 | 30 days supply | Qty: 60 | Fill #0

## 2019-03-15 MED FILL — MONTELUKAST SOD 10 MG TAB: 10 | 90 days supply | Qty: 90 | Fill #0

## 2019-03-15 MED FILL — buPROPion HCL ER (XL) 150 M: 150 | 30 days supply | Qty: 30 | Fill #0

## 2019-03-15 MED FILL — DICLOFENAC SODIUM 75 MG TAB: 75 | 30 days supply | Qty: 60 | Fill #0

## 2019-04-18 MED FILL — CARTIA XT 240 MG CAPSULE SA: 240 | 90 days supply | Qty: 90 | Fill #0

## 2019-04-18 MED FILL — DICLOFENAC SODIUM 75 MG TAB: 75 | 30 days supply | Qty: 60 | Fill #1

## 2019-04-18 MED FILL — buPROPion HCL ER (XL) 150 M: 150 | 90 days supply | Qty: 90 | Fill #0

## 2019-04-19 MED FILL — AMPHETAMINE-DEXTROAMPHETAMI: 30 | 30 days supply | Qty: 60 | Fill #0

## 2019-06-02 MED FILL — DICLOFENAC SODIUM 75 MG TAB: 75 | 30 days supply | Qty: 60 | Fill #0

## 2019-06-02 MED FILL — MONTELUKAST SOD 10 MG TAB: 10 | 90 days supply | Qty: 90 | Fill #0

## 2019-06-02 MED FILL — PROAIR RESPICLICK INHAL PWD: 108 (90 BAS | 25 days supply | Qty: 1 | Fill #0

## 2019-06-02 MED FILL — ALPRAZolam 0.5 MG TABS: 0.5 | 30 days supply | Qty: 30 | Fill #0

## 2019-06-02 MED FILL — AMPHETAMINE-DEXTROAMPHETAMI: 30 | 30 days supply | Qty: 60 | Fill #0

## 2019-06-21 DIAGNOSIS — F419 Anxiety disorder, unspecified: Secondary | ICD-10-CM | POA: Diagnosis not present

## 2019-06-21 DIAGNOSIS — R0600 Dyspnea, unspecified: Secondary | ICD-10-CM | POA: Diagnosis not present

## 2019-06-21 DIAGNOSIS — F902 Attention-deficit hyperactivity disorder, combined type: Secondary | ICD-10-CM | POA: Diagnosis not present

## 2019-06-21 DIAGNOSIS — B360 Pityriasis versicolor: Secondary | ICD-10-CM | POA: Diagnosis not present

## 2019-06-21 MED FILL — ITRACONAZOLE 100 MG CAPS: 100 | 5 days supply | Qty: 10 | Fill #0

## 2019-07-31 MED FILL — AMPHETAMINE-DEXTROAMPHETAMI: 30 | 30 days supply | Qty: 60 | Fill #0

## 2019-07-31 MED FILL — CARTIA XT 240 MG CAPSULE SA: 240 | 90 days supply | Qty: 90 | Fill #1

## 2019-07-31 MED FILL — buPROPion HCL ER (XL) 150 M: 150 | 90 days supply | Qty: 90 | Fill #1

## 2019-09-04 MED FILL — DICLOFENAC SODIUM 75 MG TAB: 75 | 30 days supply | Qty: 60 | Fill #1

## 2019-09-18 MED FILL — MONTELUKAST SOD 10 MG TAB: 10 | 90 days supply | Qty: 90 | Fill #1

## 2019-10-24 MED FILL — DICLOFENAC SODIUM 75 MG TAB: 75 | 30 days supply | Qty: 60 | Fill #2

## 2019-10-24 MED FILL — CARTIA XT 240 MG CAPSULE SA: 240 | 90 days supply | Qty: 90 | Fill #2

## 2019-10-24 MED FILL — BUPROPION HCL XL 150 MG TAB: 150 | 30 days supply | Qty: 30 | Fill #2

## 2019-10-24 MED FILL — AMPHETAMINE-DEXTROAMPHETAMI: 30 | 30 days supply | Qty: 60 | Fill #0

## 2019-11-29 DIAGNOSIS — R5383 Other fatigue: Secondary | ICD-10-CM | POA: Diagnosis not present

## 2019-11-29 DIAGNOSIS — F902 Attention-deficit hyperactivity disorder, combined type: Secondary | ICD-10-CM | POA: Diagnosis not present

## 2019-11-29 DIAGNOSIS — J029 Acute pharyngitis, unspecified: Secondary | ICD-10-CM | POA: Diagnosis not present

## 2019-11-29 DIAGNOSIS — R0981 Nasal congestion: Secondary | ICD-10-CM | POA: Diagnosis not present

## 2019-11-29 DIAGNOSIS — F419 Anxiety disorder, unspecified: Secondary | ICD-10-CM | POA: Diagnosis not present

## 2019-12-20 MED FILL — BUPROPION HCL XL 150 MG TAB: 150 | 90 days supply | Qty: 90 | Fill #0

## 2019-12-20 MED FILL — MONTELUKAST SOD 10 MG TAB: 10 | 90 days supply | Qty: 90 | Fill #0

## 2019-12-20 MED FILL — AMPHETAMINE-DEXTROAMPHETAMI: 30 | 30 days supply | Qty: 60 | Fill #0

## 2019-12-20 MED FILL — DICLOFENAC SODIUM 75 MG TAB: 75 | 30 days supply | Qty: 60 | Fill #0

## 2020-02-06 MED FILL — DICLOFENAC SODIUM 75 MG TAB: 75 | 30 days supply | Qty: 60 | Fill #3

## 2020-02-06 MED FILL — PROAIR RESPICLICK INHAL PWD: 108 (90 BAS | 25 days supply | Qty: 1 | Fill #1

## 2020-02-07 MED FILL — AMPHETAMINE-DEXTROAMPHETAMI: 30 | 30 days supply | Qty: 60 | Fill #0

## 2020-02-08 ENCOUNTER — Ambulatory Visit: Payer: 59 | Attending: Internal Medicine

## 2020-02-08 DIAGNOSIS — Z23 Encounter for immunization: Secondary | ICD-10-CM | POA: Insufficient documentation

## 2020-02-08 MED FILL — DILTIAZEM HCL ER COATED BEA: 240 | 90 days supply | Qty: 90 | Fill #0

## 2020-02-08 NOTE — Progress Notes (Signed)
   [id number]  Name:  Khris Jansson.    MRN: [id number] DOB: 10-24-1981  02/08/2020  Mr. Lindbloom was observed post Covid-19 immunization for 15 minutes without incidence. He was provided with Vaccine Information Sheet and instruction to access the V-Safe system.   Mr. Vanloan was instructed to call 911 with any severe reactions post vaccine: Marland Kitchen Difficulty breathing  . Swelling of your face and throat  . A fast heartbeat  . A bad rash all over your body  . Dizziness and weakness    Immunizations Administered    Name Date Dose VIS Date Route   Pfizer COVID-19 Vaccine 02/08/2020  5:18 PM 0.3 mL 11/24/2019 Intramuscular   Manufacturer: [employer], [employer]   Lot: [id number]   NDC: [address]-1

## 2020-03-05 ENCOUNTER — Ambulatory Visit: Payer: Self-pay | Attending: Internal Medicine

## 2020-03-05 DIAGNOSIS — Z23 Encounter for immunization: Secondary | ICD-10-CM

## 2020-03-05 NOTE — Progress Notes (Signed)
   [id number]  Name:  Andrew Chen.    MRN: [id number] DOB: 09/24/1981  03/05/2020  Mr. [name] was observed post Covid-19 immunization for 15 minutes without incident. He was provided with Vaccine Information Sheet and instruction to access the V-Safe system.   Mr. [name] was instructed to call 911 with any severe reactions post vaccine: Marland Kitchen Difficulty breathing  . Swelling of face and throat  . A fast heartbeat  . A bad rash all over body  . Dizziness and weakness   Immunizations Administered    Name Date Dose VIS Date Route   Pfizer COVID-19 Vaccine 03/05/2020  9:06 AM 0.3 mL 11/24/2019 Intramuscular   Manufacturer: [employer], [employer]   Lot: [id number]   NDC: [address]-0

## 2020-03-25 MED FILL — buPROPion HCL ER (XL) 150 M: 150 | 30 days supply | Qty: 30 | Fill #1

## 2020-03-25 MED FILL — MONTELUKAST SOD 10 MG TAB: 10 | 30 days supply | Qty: 30 | Fill #1

## 2020-03-25 MED FILL — DICLOFENAC SODIUM 75 MG TAB: 75 | 30 days supply | Qty: 60 | Fill #1

## 2020-05-13 IMAGING — CT CT RENAL STONE PROTOCOL
2 of 4 series · 16 of 46 positions shown, 18 images · non-contrast
Comparison: None.

CLINICAL DATA: Right lower quadrant pain and vomiting.

EXAM:
CT ABDOMEN AND PELVIS WITHOUT CONTRAST
TECHNIQUE: Multidetector CT imaging of the abdomen and pelvis was performed
following the standard protocol without IV contrast.

[Series 3: renal stone 5.0 · axial · 0.93mm/px · z∈[+781,+1311]mm · 13 of 116 slices shown, 15 images]
[im 5/116  soft-tissue]
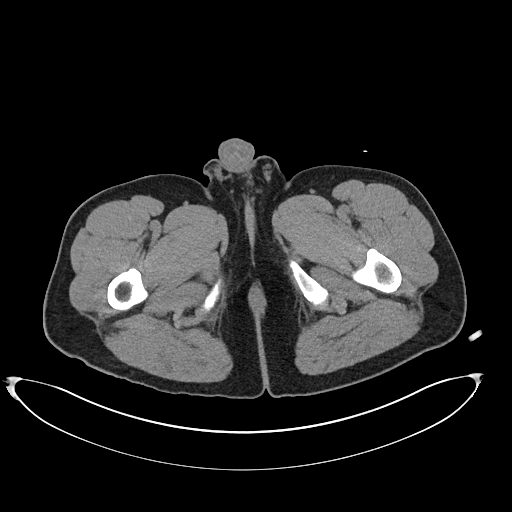
[im 5/116  bone]
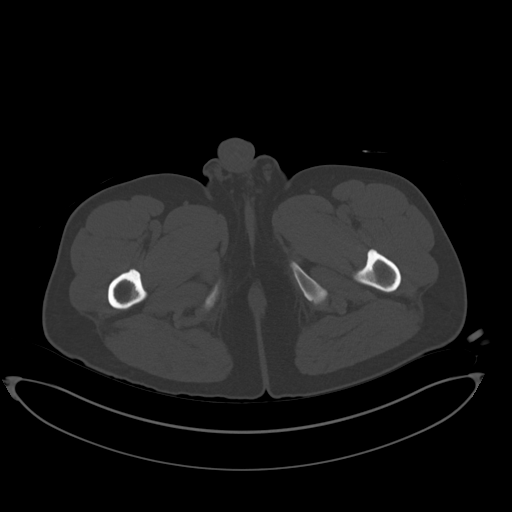
[im 14/116  soft-tissue]
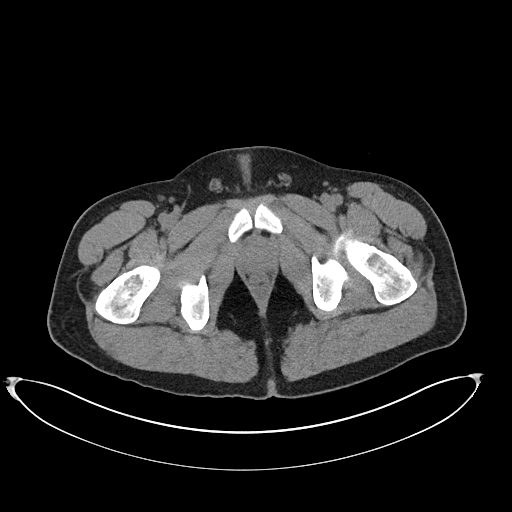
[im 24/116  soft-tissue]
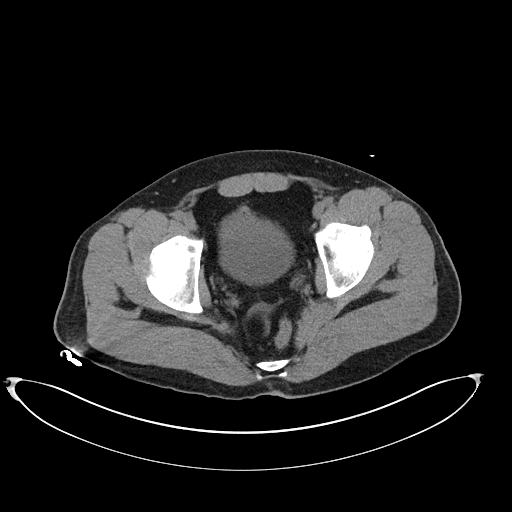
[im 33/116  soft-tissue]
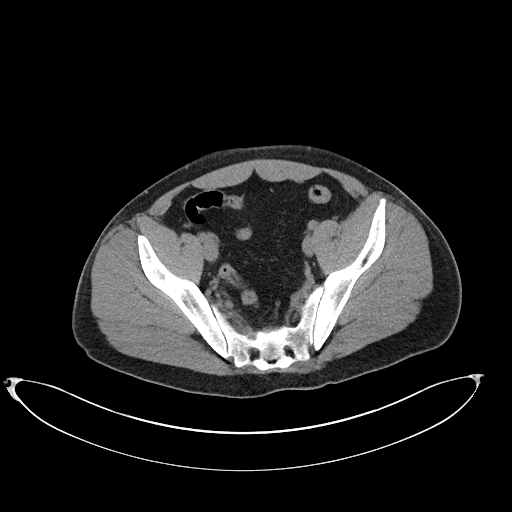
[im 42/116  soft-tissue]
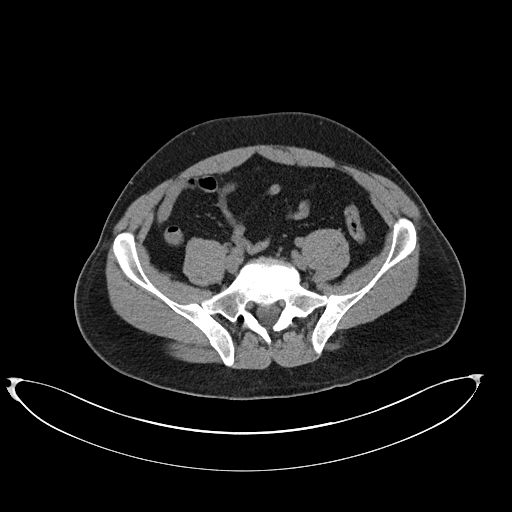
[im 51/116  soft-tissue]
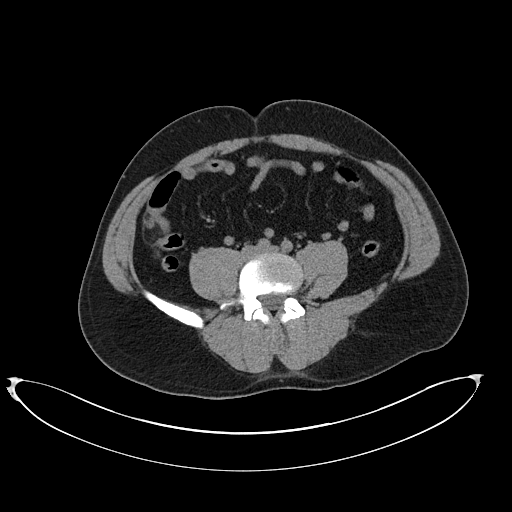
[im 60/116  soft-tissue]
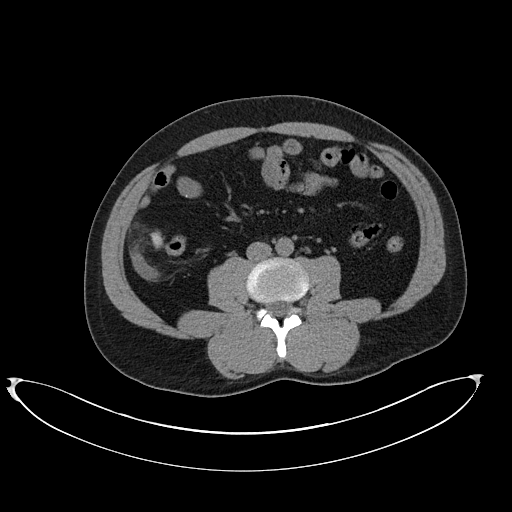
[im 65/116  soft-tissue]
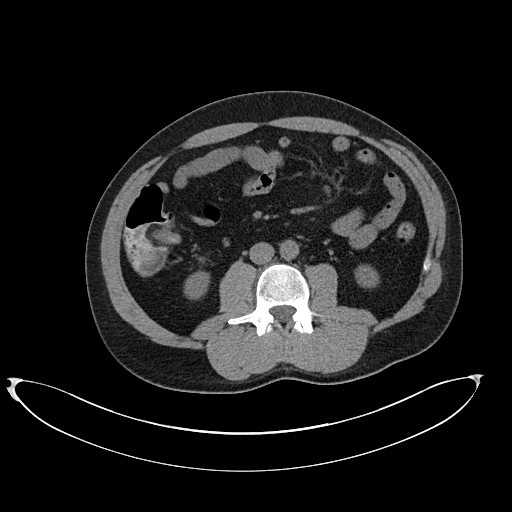
[im 74/116  soft-tissue]
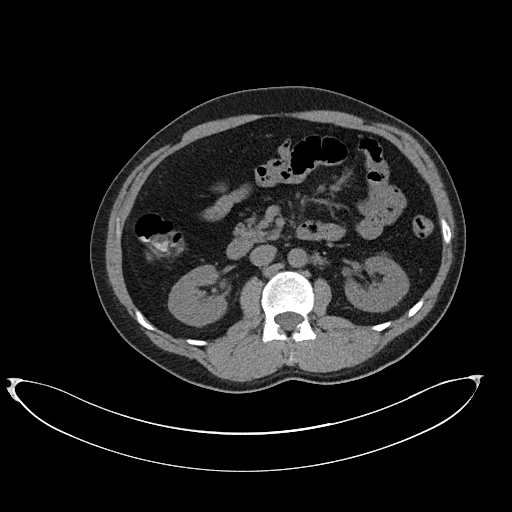
[im 74/116  bone]
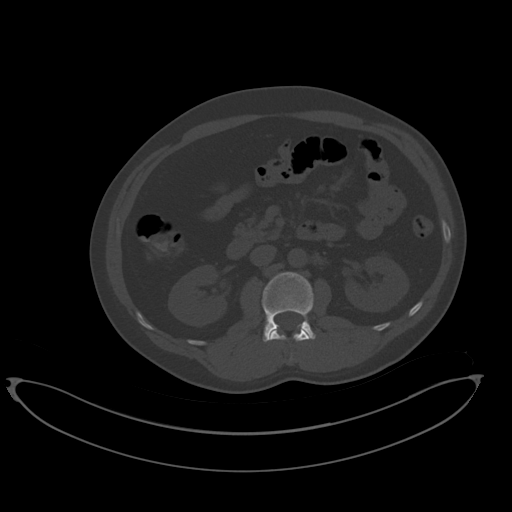
[im 83/116  soft-tissue]
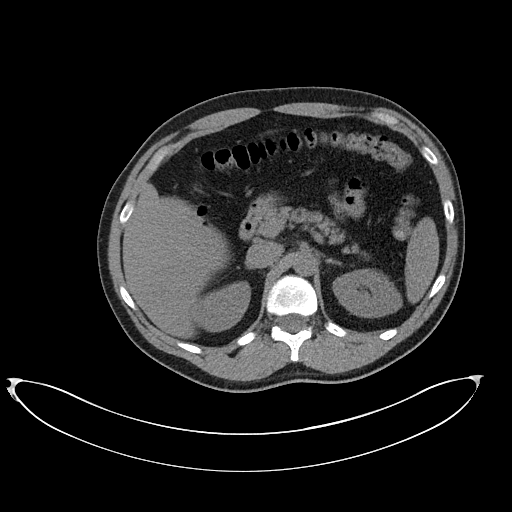
[im 93/116  soft-tissue]
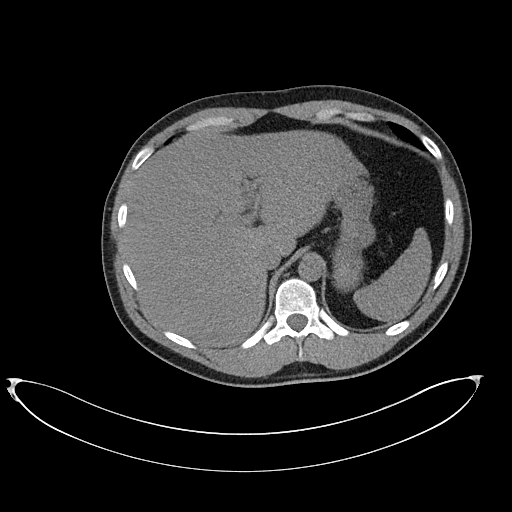
[im 102/116  soft-tissue]
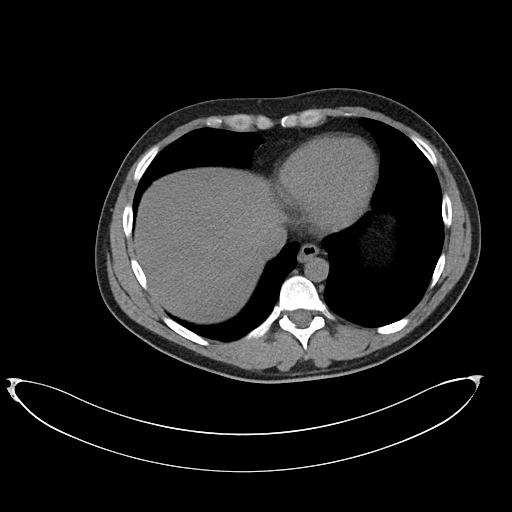
[im 111/116  soft-tissue]
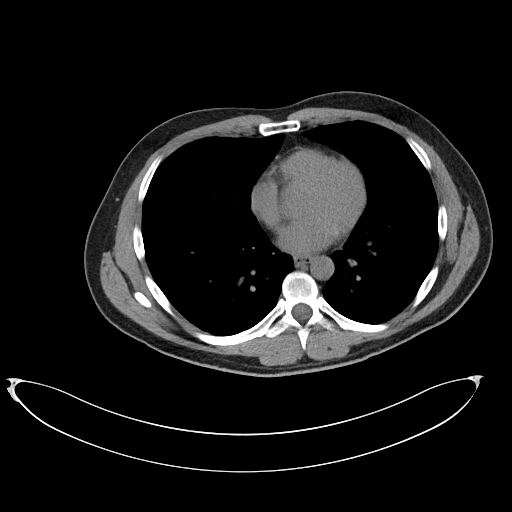

[Series 5: renal stone 3.0 cor · coronal · 0.90mm/px · 3 of 100 slices shown]
[im 34/100  soft-tissue]
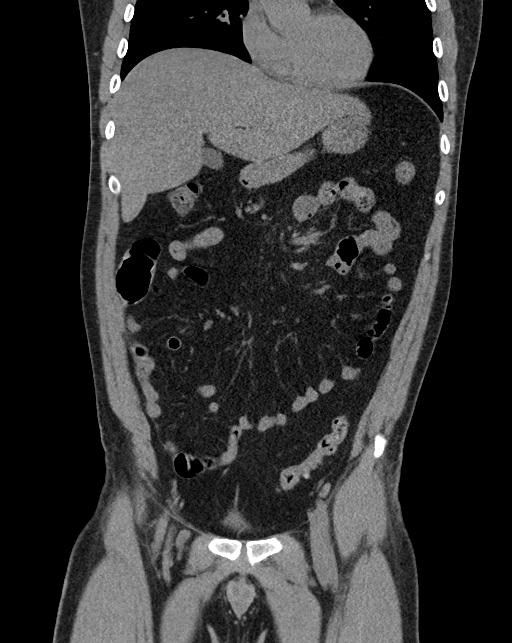
[im 45/100  soft-tissue]
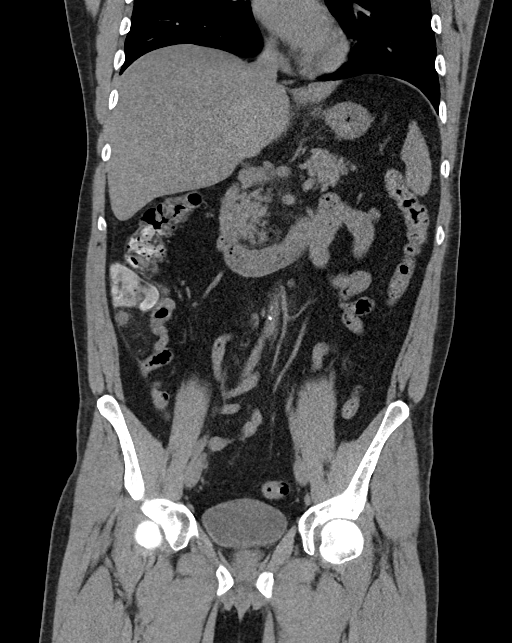
[im 56/100  soft-tissue]
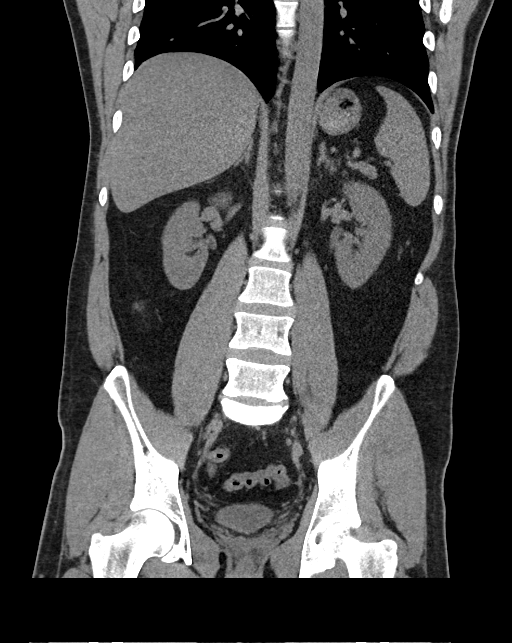

[16 of 46 positions shown; findings below may reference images not displayed]

FINDINGS: LOWER CHEST: No basilar pulmonary nodules or pleural effusion. No
apical pericardial effusion.

HEPATOBILIARY: Normal hepatic contours and density. No intra- or
extrahepatic biliary dilatation. Normal gallbladder.

PANCREAS: Normal parenchymal contours without ductal dilatation. No
peripancreatic fluid collection.

SPLEEN: Normal.

ADRENALS/URINARY TRACT:

--Adrenal glands: Normal.

--Right kidney/ureter: No hydronephrosis, nephroureterolithiasis,
perinephric stranding or solid renal mass.

--Left kidney/ureter: No hydronephrosis, nephroureterolithiasis,
perinephric stranding or solid renal mass.

--Urinary bladder: Normal for degree of distention

STOMACH/BOWEL:

--Stomach/Duodenum: No hiatal hernia or other gastric abnormality.
Normal duodenal course.

--Small bowel: No dilatation or inflammation.

--Colon: No focal abnormality.

Appendix: Location: Retrocecal

Diameter: 11 mm

Appendicolith: No

Mucosal hyper-enhancement: Mild

Extraluminal gas: No

Periappendiceal collection: No

VASCULAR/LYMPHATIC: Normal course and caliber of the major abdominal
vessels. No abdominal or pelvic lymphadenopathy.

REPRODUCTIVE: No free fluid in the pelvis.

MUSCULOSKELETAL. No bony spinal canal stenosis or focal osseous
abnormality.

OTHER: None.
IMPRESSION: Uncomplicated acute appendicitis.

## 2020-06-24 MED FILL — AMPHETAMINE-DEXTROAMPHETAMI: 30 | 30 days supply | Qty: 60 | Fill #0

## 2020-07-01 MED FILL — buPROPion HCL ER (XL) 150 M: 150 | 30 days supply | Qty: 30 | Fill #4

## 2020-07-01 MED FILL — MONTELUKAST SOD 10 MG TAB: 10 | 30 days supply | Qty: 30 | Fill #2

## 2020-07-01 MED FILL — DILTIAZEM 24HR ER 240 MG CA: 240 | 30 days supply | Qty: 30 | Fill #1

## 2020-07-01 MED FILL — DICLOFENAC SODIUM 75 MG TAB: 75 | 30 days supply | Qty: 60 | Fill #3

## 2020-07-30 MED FILL — DILTIAZEM 24HR ER 240 MG CA: 240 | 30 days supply | Qty: 30 | Fill #2

## 2020-07-30 MED FILL — MONTELUKAST SOD 10 MG TAB: 10 | 30 days supply | Qty: 30 | Fill #3

## 2020-07-30 MED FILL — buPROPion HCL ER (XL) 150 M: 150 | 30 days supply | Qty: 30 | Fill #5

## 2020-08-27 MED FILL — buPROPion HCL ER (XL) 150 M: 150 | 30 days supply | Qty: 30 | Fill #6

## 2020-08-27 MED FILL — AMPHETAMINE-DEXTROAMPHETAMI: 30 | 30 days supply | Qty: 60 | Fill #0

## 2020-08-27 MED FILL — DICLOFENAC SODIUM 75 MG TAB: 75 | 30 days supply | Qty: 60 | Fill #4

## 2020-08-27 MED FILL — MONTELUKAST SOD 10 MG TAB: 10 | 30 days supply | Qty: 30 | Fill #4

## 2020-08-27 MED FILL — DILTIAZEM 24HR ER 240 MG CA: 240 | 30 days supply | Qty: 30 | Fill #3

## 2020-09-25 ENCOUNTER — Other Ambulatory Visit (HOSPITAL_COMMUNITY): Payer: Self-pay | Admitting: Physician Assistant

## 2020-09-25 MED FILL — DILTIAZEM 24HR ER 240 MG CA: 240 | 30 days supply | Qty: 30 | Fill #0

## 2020-09-25 MED FILL — buPROPion HCL ER (XL) 150 M: 150 | 30 days supply | Qty: 30 | Fill #0

## 2020-09-25 MED FILL — MONTELUKAST SOD 10 MG TAB: 10 | 30 days supply | Qty: 30 | Fill #0

## 2020-09-25 MED FILL — DICLOFENAC SODIUM 75 MG TAB: 75 | 30 days supply | Qty: 60 | Fill #0

## 2020-09-25 MED FILL — ALPRAZolam 0.5 MG TABS: 0.5 | 30 days supply | Qty: 30 | Fill #0

## 2020-09-25 MED FILL — AMPHETAMINE-DEXTROAMPHETAMI: 30 | 30 days supply | Qty: 60 | Fill #0

## 2020-09-25 MED FILL — PROAIR RESPICLICK INHAL PWD: 108 (90 BAS | 50 days supply | Qty: 1 | Fill #0

## 2020-10-29 MED FILL — DILTIAZEM 24HR ER 240 MG CA: 240 | 30 days supply | Qty: 30 | Fill #1

## 2020-10-29 MED FILL — buPROPion HCL ER (XL) 150 M: 150 | 30 days supply | Qty: 30 | Fill #1

## 2020-10-29 MED FILL — MONTELUKAST SOD 10 MG TAB: 10 | 30 days supply | Qty: 30 | Fill #1

## 2020-11-30 MED FILL — MONTELUKAST SOD 10 MG TAB: 10 | 30 days supply | Qty: 30 | Fill #2

## 2020-11-30 MED FILL — buPROPion HCL ER (XL) 150 M: 150 | 30 days supply | Qty: 30 | Fill #2

## 2020-11-30 MED FILL — DILTIAZEM 24HR ER 240 MG CA: 240 | 30 days supply | Qty: 30 | Fill #2

## 2020-12-26 ENCOUNTER — Other Ambulatory Visit (HOSPITAL_COMMUNITY): Payer: Self-pay | Admitting: Physician Assistant

## 2020-12-26 MED FILL — DILTIAZEM ER 360MG CAPSULES: 360 | 30 days supply | Qty: 30 | Fill #0

## 2020-12-26 MED FILL — AMPHETAMINE-DEXTROAMPHETAMI: 30 | 30 days supply | Qty: 60 | Fill #0

## 2021-01-06 MED FILL — buPROPion HCL ER (XL) 150 M: 150 | 30 days supply | Qty: 30 | Fill #3

## 2021-01-06 MED FILL — MONTELUKAST SOD 10 MG TAB: 10 | 30 days supply | Qty: 30 | Fill #3

## 2021-01-06 MED FILL — DICLOFENAC SODIUM 75 MG TAB: 75 | 30 days supply | Qty: 60 | Fill #1

## 2021-02-13 MED FILL — DICLOFENAC SODIUM 75 MG TAB: 75 | 30 days supply | Qty: 60 | Fill #2

## 2021-02-13 MED FILL — buPROPion HCL ER (XL) 150 M: 150 | 30 days supply | Qty: 30 | Fill #4

## 2021-02-13 MED FILL — MONTELUKAST SOD 10 MG TAB: 10 | 30 days supply | Qty: 30 | Fill #4

## 2021-02-13 MED FILL — DILTIAZEM ER 360MG CAPSULES: 360 | 30 days supply | Qty: 30 | Fill #1

## 2021-03-04 ENCOUNTER — Other Ambulatory Visit (HOSPITAL_BASED_OUTPATIENT_CLINIC_OR_DEPARTMENT_OTHER): Payer: Self-pay

## 2021-03-07 ENCOUNTER — Other Ambulatory Visit (HOSPITAL_BASED_OUTPATIENT_CLINIC_OR_DEPARTMENT_OTHER): Payer: Self-pay

## 2021-03-14 MED FILL — MONTELUKAST SOD 10 MG TAB: 10 | 30 days supply | Qty: 30 | Fill #5

## 2021-03-14 MED FILL — buPROPion HCL ER (XL) 150 M: 150 | 30 days supply | Qty: 30 | Fill #5

## 2021-03-14 MED FILL — DILTIAZEM ER 360MG CAPSULES: 360 | 30 days supply | Qty: 30 | Fill #2

## 2021-03-14 MED FILL — DICLOFENAC SODIUM 75 MG TAB: 75 | 30 days supply | Qty: 60 | Fill #3
